# Patient Record
Sex: Female | Born: 1970 | Race: White | Hispanic: Yes | Marital: Married | Smoking: Never smoker
Health system: Southern US, Community
[De-identification: ages and names within clinical notes are randomized; demographics above are authoritative.]

## PROBLEM LIST (undated history)

## (undated) DIAGNOSIS — I10 Essential (primary) hypertension: Secondary | ICD-10-CM

## (undated) DIAGNOSIS — K219 Gastro-esophageal reflux disease without esophagitis: Secondary | ICD-10-CM

## (undated) HISTORY — DX: Essential (primary) hypertension: I10

## (undated) HISTORY — DX: Gastro-esophageal reflux disease without esophagitis: K21.9

---

## 2002-07-12 ENCOUNTER — Encounter: Payer: Self-pay | Admitting: Emergency Medicine

## 2002-07-12 ENCOUNTER — Encounter: Payer: Self-pay | Admitting: Family Medicine

## 2002-07-12 ENCOUNTER — Inpatient Hospital Stay (HOSPITAL_COMMUNITY): Admission: AD | Admit: 2002-07-12 | Discharge: 2002-07-12 | Payer: Self-pay | Admitting: Gynecology

## 2002-11-26 ENCOUNTER — Inpatient Hospital Stay (HOSPITAL_COMMUNITY): Admission: AD | Admit: 2002-11-26 | Discharge: 2002-11-28 | Payer: Self-pay | Admitting: Obstetrics and Gynecology

## 2010-03-13 ENCOUNTER — Encounter: Admission: RE | Admit: 2010-03-13 | Discharge: 2010-03-13 | Payer: Self-pay | Admitting: Geriatric Medicine

## 2011-04-23 ENCOUNTER — Ambulatory Visit: Payer: Self-pay | Attending: Neurology

## 2011-04-23 DIAGNOSIS — IMO0001 Reserved for inherently not codable concepts without codable children: Secondary | ICD-10-CM | POA: Insufficient documentation

## 2011-04-23 DIAGNOSIS — R51 Headache: Secondary | ICD-10-CM | POA: Insufficient documentation

## 2011-04-23 DIAGNOSIS — M2569 Stiffness of other specified joint, not elsewhere classified: Secondary | ICD-10-CM | POA: Insufficient documentation

## 2011-04-23 DIAGNOSIS — M542 Cervicalgia: Secondary | ICD-10-CM | POA: Insufficient documentation

## 2011-05-08 ENCOUNTER — Ambulatory Visit: Payer: Self-pay | Attending: Neurology | Admitting: Physical Therapy

## 2011-05-08 DIAGNOSIS — M2569 Stiffness of other specified joint, not elsewhere classified: Secondary | ICD-10-CM | POA: Insufficient documentation

## 2011-05-08 DIAGNOSIS — IMO0001 Reserved for inherently not codable concepts without codable children: Secondary | ICD-10-CM | POA: Insufficient documentation

## 2011-05-08 DIAGNOSIS — M542 Cervicalgia: Secondary | ICD-10-CM | POA: Insufficient documentation

## 2011-05-08 DIAGNOSIS — R51 Headache: Secondary | ICD-10-CM | POA: Insufficient documentation

## 2011-07-24 ENCOUNTER — Other Ambulatory Visit: Payer: Self-pay | Admitting: Neurology

## 2011-07-24 DIAGNOSIS — R51 Headache: Secondary | ICD-10-CM

## 2011-08-06 ENCOUNTER — Ambulatory Visit
Admission: RE | Admit: 2011-08-06 | Discharge: 2011-08-06 | Disposition: A | Discharge status: Home / Self Care | Payer: No Typology Code available for payment source | Source: Ambulatory Visit | Attending: Neurology | Admitting: Neurology

## 2011-08-06 DIAGNOSIS — R51 Headache: Secondary | ICD-10-CM

## 2012-12-18 IMAGING — CT CT HEAD W/O CM
2 series · 16 of 30 positions shown, 20 images · non-contrast
Comparison: none

[Series 3: head bone · axial · 0.49mm/px · z∈[+55,+96]mm · 3 of 28 slices shown]
[im 2/28  bone]
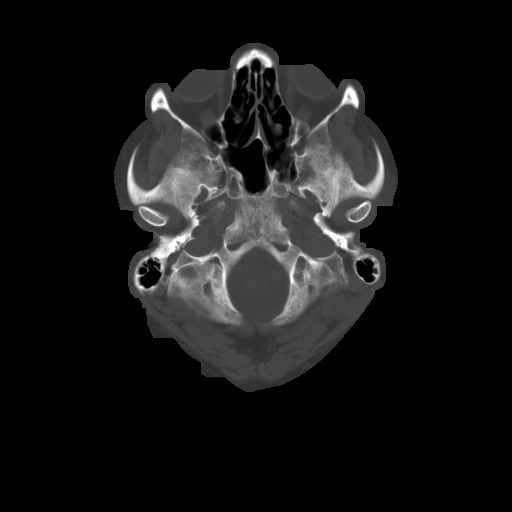
[im 6/28  bone]
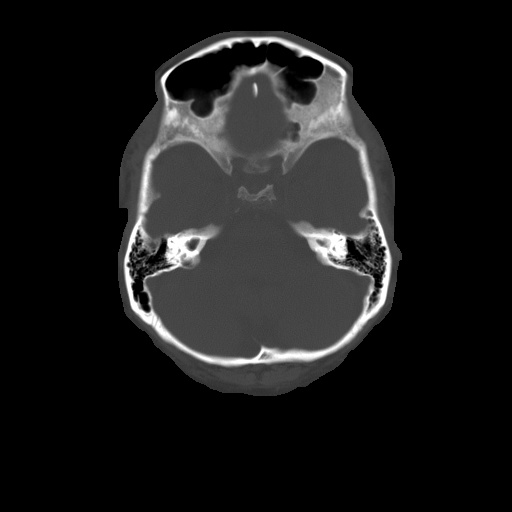
[im 10/28  bone]
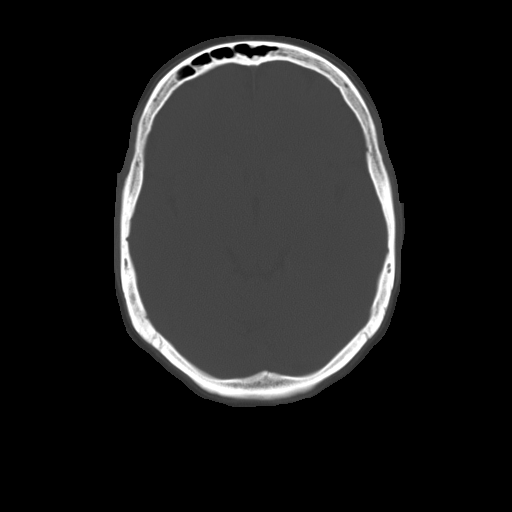

[Series 32: 3d filtered head w/o · axial · non-contrast · 0.49mm/px · z∈[+55,+179]mm · 13 of 28 slices shown, 17 images]
[im 2/28  brain]
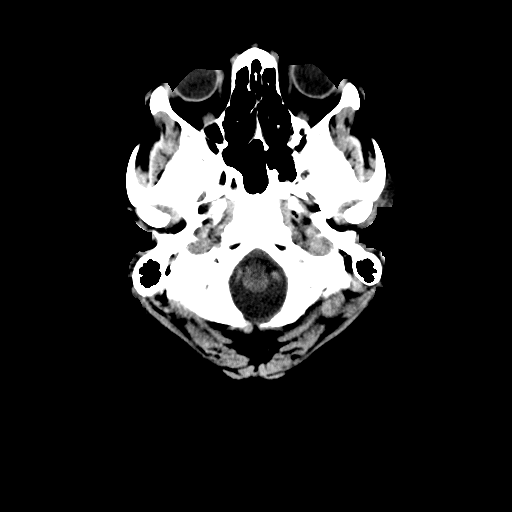
[im 2/28  bone]
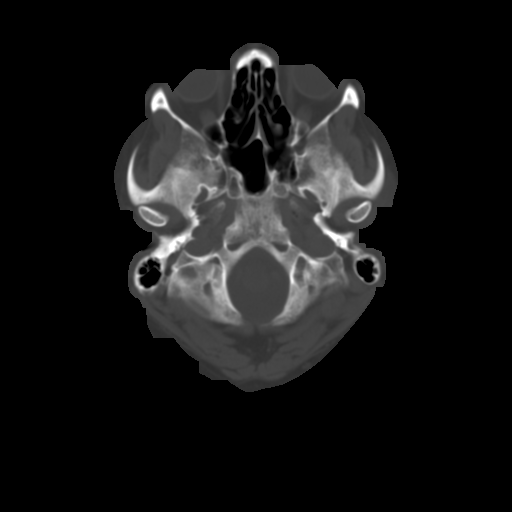
[im 4/28  brain]
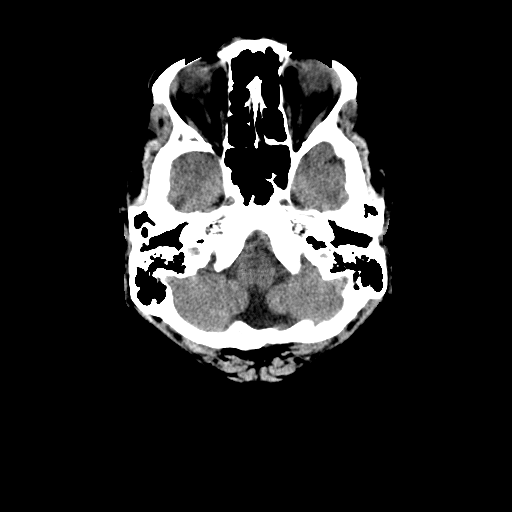
[im 6/28  brain]
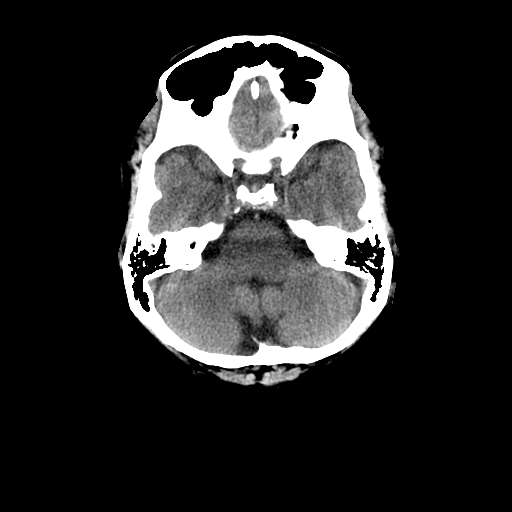
[im 8/28  brain]
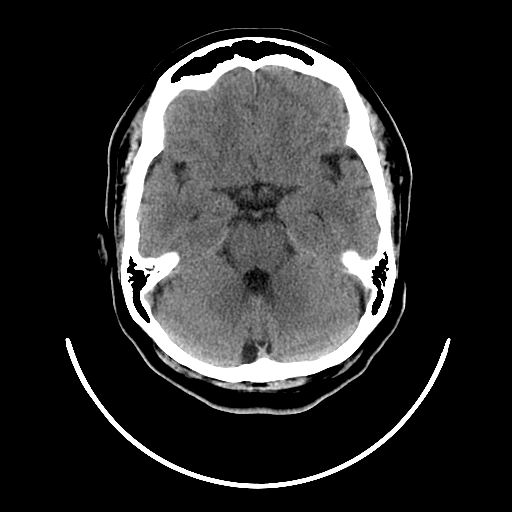
[im 10/28  brain]
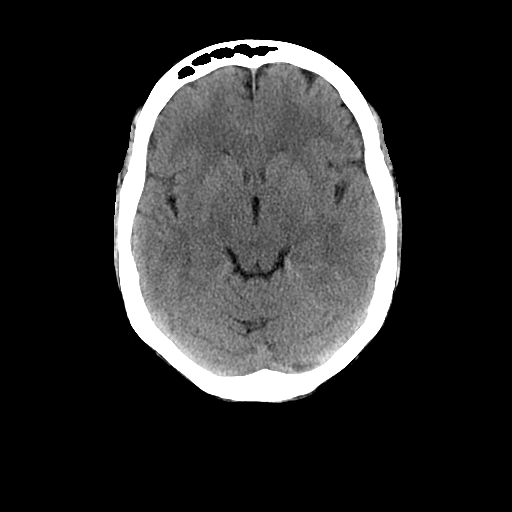
[im 10/28  bone]
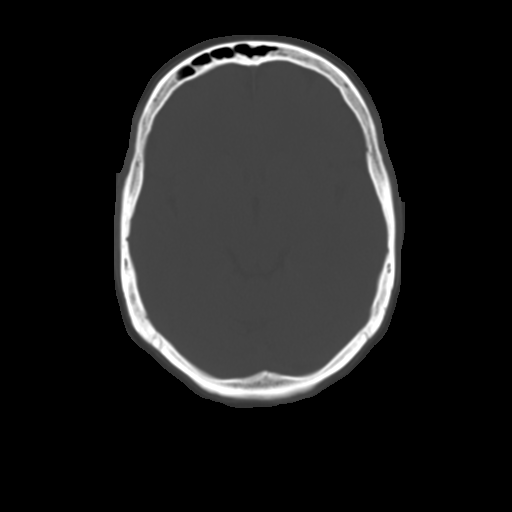
[im 12/28  brain]
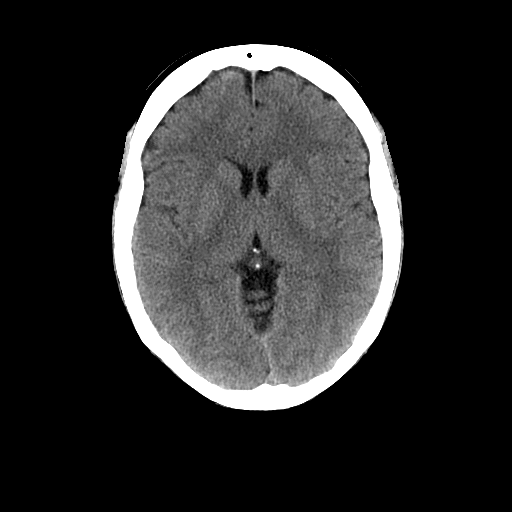
[im 14/28  brain]
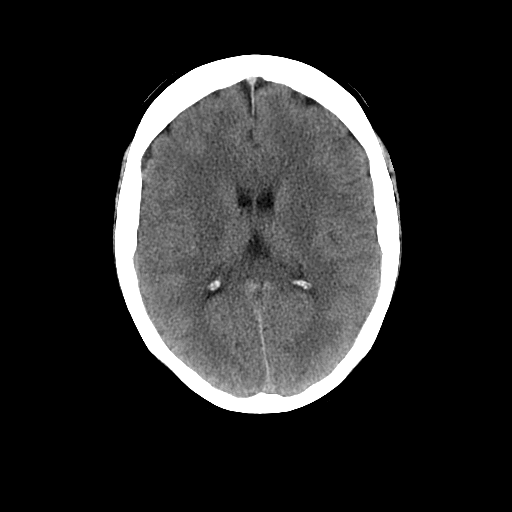
[im 16/28  brain]
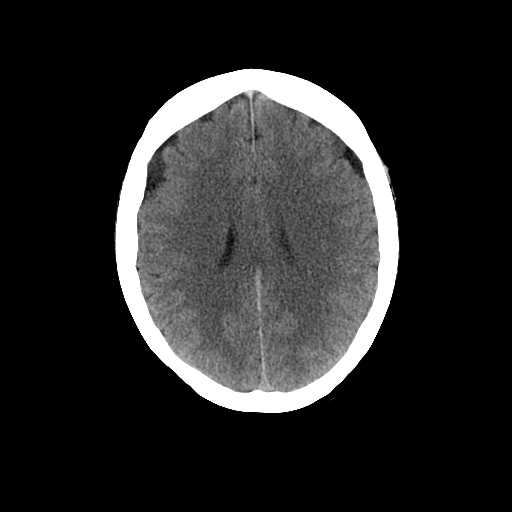
[im 18/28  brain]
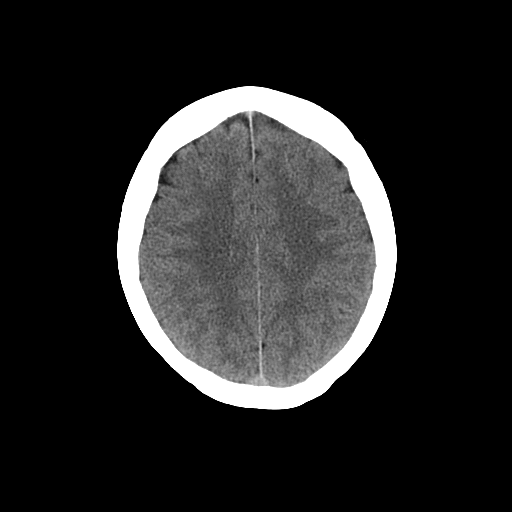
[im 18/28  bone]
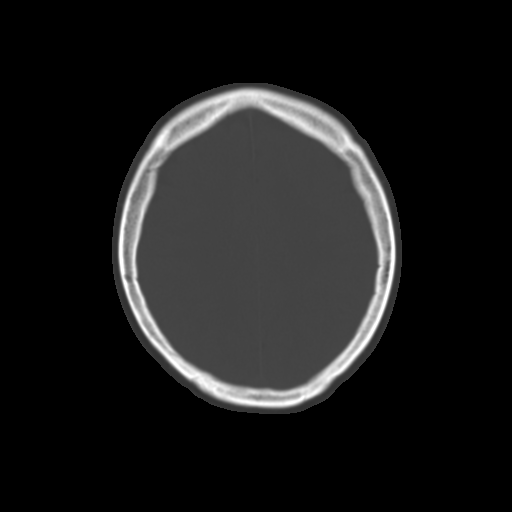
[im 20/28  brain]
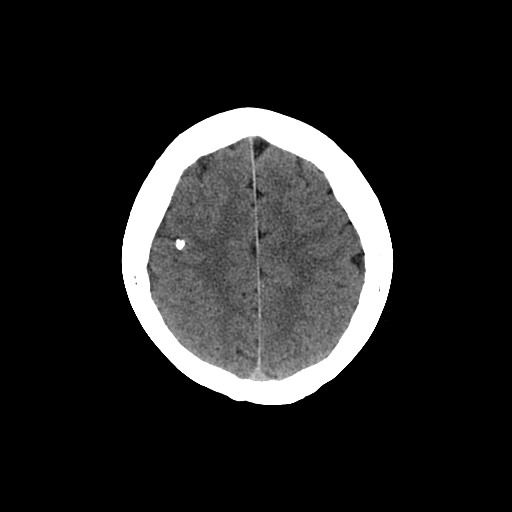
[im 22/28  brain]
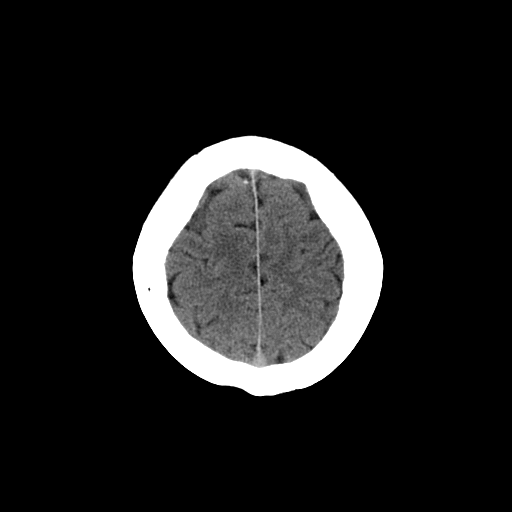
[im 24/28  brain]
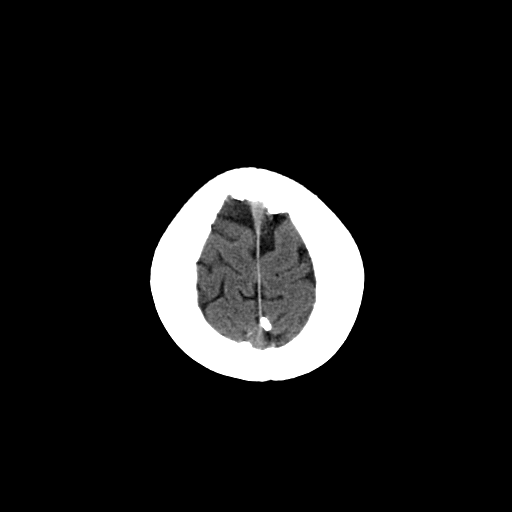
[im 26/28  brain]
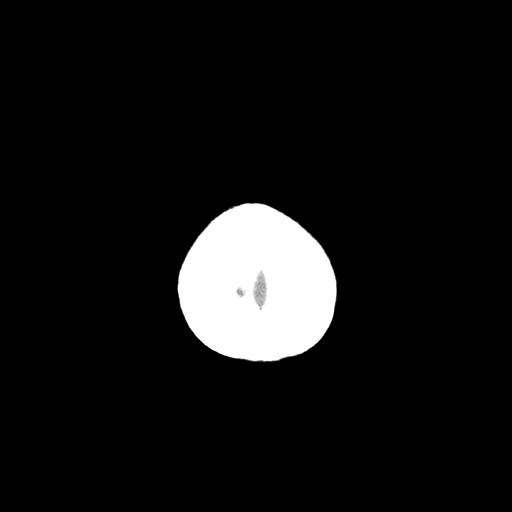
[im 26/28  bone]
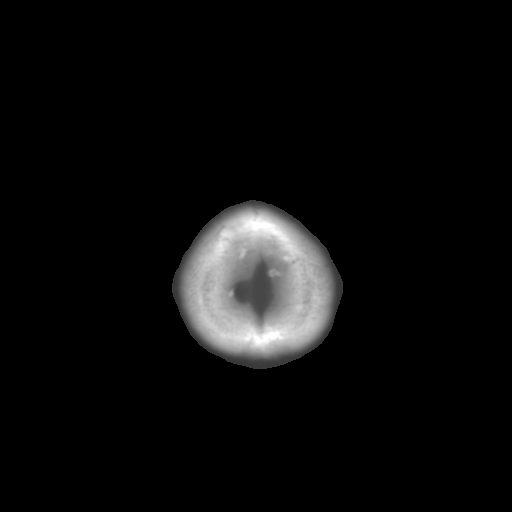

[16 of 30 positions shown; findings below may reference images not displayed]

This examination was performed at [HOSPITAL] at [HOSPITAL]
[HOSPITAL]. The interpretation will be provided by [REDACTED].

## 2013-08-18 ENCOUNTER — Encounter (HOSPITAL_COMMUNITY): Payer: Self-pay | Admitting: Emergency Medicine

## 2013-08-18 ENCOUNTER — Emergency Department (HOSPITAL_COMMUNITY)
Admission: EM | Admit: 2013-08-18 | Discharge: 2013-08-18 | Disposition: A | Discharge status: Home / Self Care | Payer: BC Managed Care – PPO | Attending: Emergency Medicine | Admitting: Emergency Medicine

## 2013-08-18 DIAGNOSIS — R209 Unspecified disturbances of skin sensation: Secondary | ICD-10-CM | POA: Insufficient documentation

## 2013-08-18 DIAGNOSIS — M79609 Pain in unspecified limb: Secondary | ICD-10-CM | POA: Insufficient documentation

## 2013-08-18 DIAGNOSIS — R1013 Epigastric pain: Secondary | ICD-10-CM | POA: Insufficient documentation

## 2013-08-18 DIAGNOSIS — M62838 Other muscle spasm: Secondary | ICD-10-CM | POA: Insufficient documentation

## 2013-08-18 MED ORDER — CYCLOBENZAPRINE HCL 10 MG PO TABS: 10.0000 mg | ORAL_TABLET | Freq: Three times a day (TID) | ORAL | Status: DC | PRN

## 2013-08-18 MED ORDER — HYDROCODONE-ACETAMINOPHEN 5-325 MG PO TABS: 1.0000 | ORAL_TABLET | Freq: Four times a day (QID) | ORAL | Status: DC | PRN

## 2013-08-18 NOTE — Discharge Instructions (Signed)
[employer] [phone]  Calambres y espasmos musculares  (Muscle Cramps and Spasms)  Los calambres musculares y espasmos ocurren cuando un msculo o grupos de msculos se tensan y no se tiene control sobre esta tensin (contraccin muscular involuntaria). Es un problema comn y Software engineerpuede aparecer en cualquier msculo. La zona ms comn son los msculos de la pantorrilla. Tanto los Liberty Globalcalambres como los espasmos son contracciones musculares involuntarias, pero tambin tienen diferencias:   Los calambres musculares son espordicos y Engineer, miningdolorosos. Pueden durar entre algunos segundos hasta un cuarto de [place]hora. Los calambres musculares son ms fuertes y duran ms que los espasmos musculares.  Los espasmos pueden o no ser dolorosos. Pueden durar algunos segundos o mucho ms. CAUSAS  No es frecuente que los calambres se deban a un trastorno subyacente grave. En muchos casos, la causa de los calambres y los espasmos es desconocida. Algunas causas frecuentes son:   Esfuerzo excesivo.   El uso excesivo del msculo por movimientos repetitivos (hacer lo mismo una y Laverda Pageotra vez).   Permanecer en cierta posicin durante un largo perodo de Allporttiempo.   Preparacin, forma o tcnica inadecuada al realizar un deporte o [place] Orleansactividad.   Deshidratacin.   Traumatismos.   Efectos secundarios de algunos medicamentos.  Niveles anormalmente bajos de las sales e iones en la sangre (electrolitos), especialmente el potasio y el calcio. Pueden ocurrir cuando se toman pldoras para Geographical information systems officerorinar (diurticos) o en las mujeres embarazadas.  Algunos problemas mdicos subyacentes pueden hacer que sea ms propenso a desarrollar calambres o espasmos. Estos incluyen, pero no se limitan a:   Diabetes.   Enfermedad de Parkinson.   Trastornos hormonales, tales como problemas de la tiroides.   El consumo excesivo de alcohol.   Enfermedades especficas de Harrah's Entertainmentlos msculos, las articulaciones y [place]los huesos.   Enfermedad vascular en la  que no llega suficiente sangre a los msculos.  INSTRUCCIONES PARA EL CUIDADO EN EL HOGAR   Mantngase bien hidratado. Beba gran cantidad de lquido para mantener la orina de tono claro o color amarillo plido.  Puede ser til Engineer, maintenance (IT)masajear, Therapist, musicelongar y International aid/development workerrelajar el msculo afectado.  Para los msculos tensos o apretados, use una toalla caliente, una almohadilla trmica o agua caliente de la ducha dirigida a la zona afectada.  Si est dolorido o siente dolor despus de un calambre o espasmo, aplique hielo en el rea afectada para Acupuncturistaliviar el malestar.  Ponga el hielo en una bolsa plstica.  Colquese una toalla entre la piel y la bolsa de hielo.  Deje el hielo en el lugar durante 15 a 20 minutos, 3 a 4 veces por da.  Los medicamentos que se utilizan para tratar las causas conocidas de los calambres o espasmos pueden reducir su frecuencia o gravedad. Tome slo medicamentos de venta libre o recetados, segn las indicaciones del mdico. SOLICITE ATENCIN MDICA SI:  Los calambres o espasmos empeoran, ocurren con ms frecuencia o no mejoran con Museum/gallery conservatorel tiempo.  ASEGRESE DE QUE:   Comprende estas instrucciones.  Controlar su enfermedad.  Solicitar ayuda de inmediato si no mejora o si empeora. Document Released: 01/30/2005 Document Revised: 08/17/2012 [facility] Patient Information 2014 [place]ExitCare, [place]. Cyclobenzaprine tablets Qu es este medicamento? La CICLOBENZAPRINA es un relajante muscular. Se utiliza para tratar Starwood Hotelsel dolor y la rigidez de los msculos y espasmos musculares. Este medicamento puede ser utilizado para otros usos; si tiene alguna pregunta consulte con su proveedor de atencin mdica o con su farmacutico. MARCAS COMERCIALES DISPONIBLES: Fexmid, Flexeril Qu le debo informar a mi profesional de  la salud antes de tomar este medicamento? Necesita saber si usted presenta alguno de los siguientes problemas o situaciones: -enfermedad cardiaca, pulso cardiaco irregular o ataque cardiaco  previo -enfermedad heptica -problemas tiroideos -una reaccin alrgica o inusual a la ciclobenzaprina, antidepresivos tricclicos, lactosa, a otros medicamentos, alimentos, colorantes o conservadores -si est embarazada o buscando quedar embarazada -si est amamantando a un beb Cmo debo utilizar este medicamento? Tome este medicamento por va oral con un vaso de agua. Siga las instrucciones de la etiqueta del [place]. Si este Social worker, tmelo con alimentos o con [employer]. No tome su medicamento con una frecuencia mayor a la indicada. Hable con su pediatra para informarse acerca del uso de este medicamento en nios. Puede requerir atencin especial. Sobredosis: Pngase en contacto inmediatamente con un centro toxicolgico o una sala de urgencia si usted cree que haya tomado demasiado medicamento. ATENCIN: [employer] es solo para usted. No comparta este medicamento con nadie. Qu sucede si me olvido de una dosis? Si olvida una dosis, tmela lo antes posible. Si es casi la hora de su dosis siguiente, tome slo esa dosis. No tome dosis adicionales o dobles. Qu puede interactuar con este medicamento? No tome esta medicina con ninguno de los siguientes medicamentos: -ciertos medicamentos para infecciones micticas, tales como fluconazol, quetoconazol, itraconazol, posaconazol, voriconazol -cisapride -dofetilida -dronedarona -droperidol -flecainida -grepafloxacino -halofantrina -levometadilo -IMAOs tales como Carbex, Eldepryl, Marplan, Nardil y Parnate -nilotinib -pimozida -probucol -sertindol -tioridazina -ziprasidona Esta medicina tambin puede interactuar con los siguientes medicamentos: -abarlix -alcohol -ciertos medicamentos para el cncer -ciertos medicamentos para la depresin, ansiedad o trastornos psicticos -ciertos medicamentos para infecciones, tales como alfuzosn, cloroquina, claritromicina, levofloxacino, mefloquina,  pentamidina, troleandomicina -ciertos medicamentos para el pulso cardiaco irregular -ciertos medicamentos usados para hacerle dormir o para entumecer durante una ciruga o procedimiento -colorantes de Arts development officer -dolasetrn -guanetidina -metadona -octreotida -ondansetrn -otros medicamentos que prolongan el intervalo QT (causa un ritmo cardiaco anormal) -palonosetrn -fenotiazinas, tales como clorpromacina, mesoridazina, proclorperazina, tioridazina -tramadol -vardenafil Puede ser que esta lista no menciona todas las posibles interacciones. Informe a su profesional de [employer] de [employer] Inc productos a base de hierbas, medicamentos de [place] o suplementos nutritivos que est tomando. Si usted fuma, consume bebidas alcohlicas o si utiliza drogas ilegales, indqueselo tambin a su profesional de [employer]. Algunas sustancias pueden interactuar con su medicamento. A qu debo estar atento al usar [employer]? Consulte a su mdico o a su profesional de la salud si su problema no mejora en 1 a 3 semanas. Puede experimentar somnolencia o mareos al comenzar con el medicamento o al cambiar de dosis. No conduzca ni utilice maquinaria, ni haga nada que sea peligroso hasta que sepa cmo le afecta este medicamento. Sintese y pngase de pie lentamente. Se le podr secar la boca. Masticar chicle sin azcar, chupar caramelos duros y beber agua le ayudarn a Pharmacologist la boca hmeda. Qu efectos secundarios puedo tener al [employer] este medicamento? Efectos secundarios que debe informar a su mdico o a Producer, television/film/video de la salud tan pronto como sea posible: -Therapist, art como erupcin cutnea, picazn o urticarias, hinchazn de la cara, labios o lengua -dolor en el pecho -pulso cardiaco rpido -alucinaciones -convulsiones -vmito Efectos secundarios que, por lo general, no requieren atencin mdica (debe informarlos a su mdico o a su profesional de la salud si persisten o si son  molestos): -dolor de cabeza Puede ser que esta lista no menciona todos los posibles efectos secundarios. Comunquese a su mdico por asesoramiento  mdico Hewlett-Packardsobre los efectos secundarios. Usted puede informar los efectos secundarios a la FDA por telfono al 1-800-FDA-1088. Dnde debo guardar mi medicina? Mantngala fuera del alcance de los nios. Gurdela a Sanmina-SCItemperatura ambiente, entre 15 y 30 grados C ([phone] y [phone] grados F). Mantenga el envase bien cerrado. Deseche todo el medicamento que no haya utilizado, despus de la fecha de vencimiento. ATENCIN: Este folleto es un resumen. Puede ser que no cubra toda la posible informacin. Si usted tiene preguntas acerca de esta medicina, consulte con su mdico, su farmacutico o su profesional de Radiographer, therapeuticla salud.  2014, Elsevier/Gold Standard. (2012-12-11 16:35:40)  Cyclobenzaprine tablets Qu es este medicamento? La CICLOBENZAPRINA es un relajante muscular. Se utiliza para tratar Starwood Hotelsel dolor y la rigidez de los msculos y espasmos musculares. Este medicamento puede ser utilizado para otros usos; si tiene alguna pregunta consulte con su proveedor de atencin mdica o con su farmacutico. MARCAS COMERCIALES DISPONIBLES: Fexmid, Flexeril Qu le debo informar a mi profesional de la salud antes de tomar este medicamento? Necesita saber si usted presenta alguno de los siguientes problemas o situaciones: -enfermedad cardiaca, pulso cardiaco irregular o ataque cardiaco previo -enfermedad heptica -problemas tiroideos -una reaccin alrgica o inusual a la ciclobenzaprina, antidepresivos tricclicos, lactosa, a otros medicamentos, alimentos, colorantes o conservadores -si est embarazada o buscando quedar embarazada -si est amamantando a un beb Cmo debo utilizar este medicamento? Tome este medicamento por va oral con un vaso de agua. Siga las instrucciones de la etiqueta del [place] Creekmedicamento. Si este Social workermedicamento le produce malestar estomacal, tmelo con alimentos o con  WPS Resourcesleche. No tome su medicamento con una frecuencia mayor a la indicada. Hable con su pediatra para informarse acerca del uso de este medicamento en nios. Puede requerir atencin especial. Sobredosis: Pngase en contacto inmediatamente con un centro toxicolgico o una sala de urgencia si usted cree que haya tomado demasiado medicamento. ATENCIN: Reynolds AmericanEste medicamento es solo para usted. No comparta este medicamento con nadie. Qu sucede si me olvido de una dosis? Si olvida una dosis, tmela lo antes posible. Si es casi la hora de su dosis siguiente, tome slo esa dosis. No tome dosis adicionales o dobles. Qu puede interactuar con este medicamento? No tome esta medicina con ninguno de los siguientes medicamentos: -ciertos medicamentos para infecciones micticas, tales como fluconazol, quetoconazol, itraconazol, posaconazol, voriconazol -cisapride -dofetilida -dronedarona -droperidol -flecainida -grepafloxacino -halofantrina -levometadilo -IMAOs tales como Carbex, Eldepryl, Marplan, Nardil y Parnate -nilotinib -pimozida -probucol -sertindol -tioridazina -ziprasidona Esta medicina tambin puede interactuar con los siguientes medicamentos: -abarlix -alcohol -ciertos medicamentos para el cncer -ciertos medicamentos para la depresin, ansiedad o trastornos psicticos -ciertos medicamentos para infecciones, tales como alfuzosn, cloroquina, claritromicina, levofloxacino, mefloquina, pentamidina, troleandomicina -ciertos medicamentos para el pulso cardiaco irregular -ciertos medicamentos usados para hacerle dormir o para entumecer durante una ciruga o procedimiento -colorantes de Arts development officercontraste -dolasetrn -guanetidina -metadona -octreotida -ondansetrn -otros medicamentos que prolongan el intervalo QT (causa un ritmo cardiaco anormal) -palonosetrn -fenotiazinas, tales como clorpromacina, mesoridazina, proclorperazina, tioridazina -tramadol -vardenafil Puede ser que esta lista no  menciona todas las posibles interacciones. Informe a su profesional de Beazer Homesla salud de Ingram Micro Inctodos los productos a base de hierbas, medicamentos de University of Virginiaventa libre o suplementos nutritivos que est tomando. Si usted fuma, consume bebidas alcohlicas o si utiliza drogas ilegales, indqueselo tambin a su profesional de Beazer Homesla salud. Algunas sustancias pueden interactuar con su medicamento. A qu debo estar atento al usar PPL Corporationeste medicamento? Consulte a su mdico o a su profesional de la salud si su problema no mejora en 1 a  3 semanas. Puede experimentar somnolencia o mareos al comenzar con el medicamento o al cambiar de dosis. No conduzca ni utilice maquinaria, ni haga nada que sea peligroso hasta que sepa cmo le afecta este medicamento. Sintese y pngase de pie lentamente. Se le podr secar la boca. Masticar chicle sin azcar, chupar caramelos duros y beber agua le ayudarn a Pharmacologist la boca hmeda. Qu efectos secundarios puedo tener al [employer] este medicamento? Efectos secundarios que debe informar a su mdico o a Producer, television/film/video de la salud tan pronto como sea posible: -Therapist, art como erupcin cutnea, picazn o urticarias, hinchazn de la cara, labios o lengua -dolor en el pecho -pulso cardiaco rpido -alucinaciones -convulsiones -vmito Efectos secundarios que, por lo general, no requieren atencin mdica (debe informarlos a su mdico o a su profesional de la salud si persisten o si son molestos): -dolor de cabeza Puede ser que esta lista no menciona todos los posibles efectos secundarios. Comunquese a su mdico por asesoramiento mdico Hewlett-[employer]ard. Usted puede informar los efectos secundarios a la FDA por telfono al 1-800-FDA-1088. Dnde debo guardar mi medicina? Mantngala fuera del alcance de los nios. Gurdela a Sanmina-SCI, entre 15 y 30 grados C (40 y 70 grados F). Mantenga el envase bien cerrado. Deseche todo el medicamento que no haya utilizado, despus de la  fecha de vencimiento. ATENCIN: Este folleto es un resumen. Puede ser que no cubra toda la posible informacin. Si usted tiene preguntas acerca de esta medicina, consulte con su mdico, su farmacutico o su profesional de Radiographer, therapeutic.  2014, Elsevier/Gold Standard. (2012-12-11 16:35:40)

## 2013-08-18 NOTE — ED Provider Notes (Signed)
CSN: [id number]     Arrival date & time 08/18/13  1549 History   First MD Initiated Contact with Patient 08/18/13 1711     This chart was scribed for non-physician practitioner, Arthor CaptainAbigail Banks Chaikin, PA-C, working with Gwyneth SproutWhitney Plunkett, MD by Arlan OrganAshley Leger, ED Scribe. This patient was seen in room TR08C/TR08C and the patient's care was started at 6:29 PM.   Chief Complaint  Patient presents with  . Back Pain   The history is provided by the patient. No language interpreter was used.    HPI Comments: Michelle Clay is a 43 y.o. female who presents to the Emergency Department complaining of intermittent lower back pain that has been ongoing for some time now. She states this pain radiates into her L arm and has noted associated mild paresthesia to the arm. She describes this pain as sharp. No alleviating or aggravating factors at this time. Denies any new injury or heavy lifting. She states this pain is exacerbated with deep breathing. Pt also admits to epigastric abdominal pain. She has been taking Advil once daily with mild temporary improvement. She has also recently noted bilateral calf pain. Denies any history of lower extremity or lung blood clots. No history of heart issues. Pt is currently not a smoker. No family history of MI's. Denies weakness, loss of bowel/bladder function or saddle anesthesia. Denies neck stiffness, headache, rash.  Denies fever or recent procedures to back. No coughing up blood. 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 No history of high cholesterol or HTN. No other concerns this visit.  History reviewed. No pertinent past medical history. History reviewed. No pertinent past surgical history. No family history on file. History  Substance Use Topics  . Smoking status: Never Smoker   . Smokeless tobacco: Not on file  . Alcohol Use: No   OB History   Grav Para Term Preterm Abortions TAB SAB Ect Mult Living                  Review of Systems  Constitutional: Negative for fever and chills.  HENT: Negative for congestion.   Eyes: Negative for redness.  Respiratory: Negative for cough.   Gastrointestinal: Positive for abdominal pain.  Musculoskeletal: Positive for arthralgias and back pain.  Skin: Negative for rash.  Psychiatric/Behavioral: Negative for confusion.      Allergies  Review of patient's allergies indicates no known allergies.  Home Medications   Prior to Admission medications   Not on File   Triage Vitals: BP 151/71  Pulse 81  Temp(Src) 98.2 F (36.8 C) (Oral)  Resp 18  SpO2 99%  LMP 08/18/2013   Physical Exam  Nursing note and vitals reviewed. Constitutional: She is oriented to person, place, and time. She appears well-developed and well-nourished.  HENT:  Head: Normocephalic and atraumatic.  Eyes: EOM are normal.  Neck: Normal range of motion.  Cardiovascular: Normal rate.   Pulmonary/Chest: Effort normal.  Musculoskeletal: Normal range of motion. She exhibits tenderness. She exhibits no edema.  FROM with pain Spasms over trapezius muscle; can palpate and reproduce the pain in her L arm  Neurological: She is alert and oriented to person, place, and time.  Skin: Skin is warm and dry.  Psychiatric: She has a normal mood and affect. Her behavior is normal.    ED Course  Procedures (including critical care time)  DIAGNOSTIC STUDIES: Oxygen Saturation is 99% on RA, Normal by my interpretation.    COORDINATION  OF CARE: 6:39 PM-Discussed treatment plan with pt at bedside and pt agreed to plan.     Labs Review Labs Reviewed - No data to display  Imaging Review No results found.   EKG Interpretation None      MDM   Final diagnoses:  Trapezius muscle spasm    Patient with back pain.  No neurological deficits and normal neuro exam.  Patient can walk but states is painful.  No loss of bowel or bladder control.  No concern for cauda equina.  No fever, night  sweats, weight loss, h/o cancer, IVDU.  RICE protocol and pain medicine indicated and discussed with patient.    I personally performed the services described in this documentation, which was scribed in my presence. The recorded information has been reviewed and is accurate.    Arthor CaptainAbigail Orvel Cutsforth, PA-C 08/24/13 [phone]

## 2013-08-18 NOTE — ED Notes (Signed)
The pt has had lower back pain for a long time.  No known injury.  More pain for a few days

## 2013-08-25 NOTE — ED Provider Notes (Signed)
Medical screening examination/treatment/procedure(s) were performed by non-physician practitioner and as supervising physician I was immediately available for consultation/collaboration.   EKG Interpretation None        Noriko Macari, MD 08/25/13 1542 

## 2014-08-19 ENCOUNTER — Encounter (HOSPITAL_COMMUNITY): Payer: Self-pay | Admitting: Emergency Medicine

## 2014-08-19 ENCOUNTER — Emergency Department (HOSPITAL_COMMUNITY)
Admission: EM | Admit: 2014-08-19 | Discharge: 2014-08-19 | Disposition: A | Discharge status: Home / Self Care | Payer: BLUE CROSS/BLUE SHIELD | Source: Home / Self Care

## 2014-08-19 DIAGNOSIS — A048 Other specified bacterial intestinal infections: Secondary | ICD-10-CM

## 2014-08-19 DIAGNOSIS — B9681 Helicobacter pylori [H. pylori] as the cause of diseases classified elsewhere: Secondary | ICD-10-CM | POA: Diagnosis not present

## 2014-08-19 DIAGNOSIS — R1013 Epigastric pain: Secondary | ICD-10-CM | POA: Diagnosis not present

## 2014-08-19 LAB — POCT PREGNANCY, URINE: Preg Test, Ur: NEGATIVE

## 2014-08-19 LAB — POCT URINALYSIS DIP (DEVICE)
Bilirubin Urine: NEGATIVE
GLUCOSE, UA: NEGATIVE mg/dL
Hgb urine dipstick: NEGATIVE
Ketones, ur: NEGATIVE mg/dL
Leukocytes, UA: NEGATIVE
NITRITE: NEGATIVE
Protein, ur: NEGATIVE mg/dL
SPECIFIC GRAVITY, URINE: 1.015 (ref 1.005–1.030)
UROBILINOGEN UA: 0.2 mg/dL (ref 0.0–1.0)
pH: 7.5 (ref 5.0–8.0)

## 2014-08-19 LAB — POCT H PYLORI SCREEN: H. PYLORI SCREEN, POC: POSITIVE — AB

## 2014-08-19 MED ORDER — AMOXICILLIN 500 MG PO CAPS: 1000.0000 mg | ORAL_CAPSULE | Freq: Two times a day (BID) | ORAL | Status: DC

## 2014-08-19 MED ORDER — ONDANSETRON HCL 4 MG PO TABS: 4.0000 mg | ORAL_TABLET | Freq: Three times a day (TID) | ORAL | Status: DC | PRN

## 2014-08-19 MED ORDER — OMEPRAZOLE 20 MG PO CPDR: 20.0000 mg | DELAYED_RELEASE_CAPSULE | Freq: Two times a day (BID) | ORAL | Status: DC

## 2014-08-19 MED ORDER — CLARITHROMYCIN 500 MG PO TABS: 500.0000 mg | ORAL_TABLET | Freq: Two times a day (BID) | ORAL | Status: DC

## 2014-08-19 NOTE — Discharge Instructions (Signed)
The cause of your abdominal pain is not immediately clear but is likely due to multiple things such as an H. pylori infection as well as stones. It may also be related to pancreatitis. Please go to the emergency room if her pain returns. Please start the 3 medicines prescribed for the infection and Zofran for nausea.  La causa de su dolor abdominal no es claro, pero probablemente se debe a varias cosas tales como una infeccin por H. pylori, as como piedras. Tambin puede estar relacionado con pancreatitis. Por favor, vaya a la sala de emergencia si su dolor vuelve. Por favor, iniciar las 3 medicinas prescritas para la infeccin y Zofran para las nuseas.

## 2014-08-19 NOTE — ED Provider Notes (Signed)
CSN: [id number]     Arrival date & time 08/19/14  1116 History   None    Chief Complaint  Patient presents with  . Abdominal Pain   (Consider location/radiation/quality/duration/timing/severity/associated sxs/prior Treatment) HPI  Abdominal pain: started 2 years ago. Intermittent. Multiple ED visits for pain w/o Dx. Last episode of pain was 1 day ago. Epigastric. "mild" but sometimes shooting. Improving. Improving. Nothing relieves the pain. Has not taken anything for the pain. BM daily. Non-radiating. Associated w/ emesis after meals. Worse w/ food.    LMP 07/26/14.    History reviewed. No pertinent past medical history. History reviewed. No pertinent past surgical history. Family History  Problem Relation Age of Onset  . Family history unknown: Yes   History  Substance Use Topics  . Smoking status: Never Smoker   . Smokeless tobacco: Not on file  . Alcohol Use: No   OB History    No data available     Review of Systems Per HPI with all other pertinent systems negative.   Allergies  Review of patient's allergies indicates no known allergies.  Home Medications   Prior to Admission medications   Not on File   BP 134/74 mmHg  Pulse 73  Temp(Src) 97.9 F (36.6 C) (Oral)  Resp 16  SpO2 100%  LMP 07/26/2014 Physical Exam Physical Exam  Constitutional: oriented to person, place, and time. appears well-developed and well-nourished. No distress.  HENT:  Head: Normocephalic and atraumatic.  Eyes: EOMI. PERRL.  Neck: Normal range of motion.  Cardiovascular: RRR, no m/r/g, 2+ distal pulses,  Pulmonary/Chest: Effort normal and breath sounds normal. No respiratory distress.  Abdominal: Soft. Bowel sounds are normal. NonTTP, no distension. negative Murphy's sign.  Musculoskeletal: Normal range of motion. Non ttp, no effusion.  Neurological: alert and oriented to person, place, and time.  Skin: Skin is warm. No rash noted. non diaphoretic.  Psychiatric: normal mood and  affect. behavior is normal. Judgment and thought content normal.   ED Course  Procedures (including critical care time) Labs Review Labs Reviewed - No data to display  Imaging Review No results found.   MDM  No diagnosis found.  Suspect her pain is likely multifactorial which includes an H. pylori infection and possible cholecystitis.. Unlikely pancreatitis, appendicitis or other intra-abdominal process. Zofran Clarithromycin, amoxicillin, PPI Follow-up in the ED if patient's abdominal pain comes back. Urinalysis negative, urine pregnancy negative   Ozella Rocksavid J Merrell, MD 08/19/14 [phone]

## 2014-08-19 NOTE — ED Notes (Signed)
C/o intermittent epigastric pain onset 2 years  Pain increases w/foods Denies fevers, chills, urinary/gyn sx Alert, no signs of acute distress.

## 2017-06-30 ENCOUNTER — Other Ambulatory Visit: Payer: Self-pay

## 2017-07-04 LAB — CYTOLOGY - PAP: Diagnosis: NEGATIVE

## 2019-06-17 ENCOUNTER — Other Ambulatory Visit (HOSPITAL_COMMUNITY): Payer: Self-pay

## 2019-06-17 DIAGNOSIS — Z1231 Encounter for screening mammogram for malignant neoplasm of breast: Secondary | ICD-10-CM

## 2019-07-22 ENCOUNTER — Encounter: Payer: Self-pay | Admitting: Women's Health

## 2019-07-22 ENCOUNTER — Other Ambulatory Visit: Payer: Self-pay

## 2019-07-22 ENCOUNTER — Ambulatory Visit: Payer: Self-pay | Admitting: Women's Health

## 2019-07-22 VITALS — BP 150/98

## 2019-07-22 DIAGNOSIS — Z1239 Encounter for other screening for malignant neoplasm of breast: Secondary | ICD-10-CM

## 2019-07-22 NOTE — Progress Notes (Signed)
Ms. Michelle Clay is a 49 y.o. female who presents to [facility] clinic today with no complaints.    Pap Smear: Pap not smear completed today. Last Pap smear was 06/30/2017 at [facility] clinic and was normal. Per patient has no history of an abnormal Pap smear. Last Pap smear result is available in Epic.   Physical exam: Breasts Breasts symmetrical. No skin abnormalities bilateral breasts. No nipple retraction bilateral breasts. No nipple discharge bilateral breasts. No lymphadenopathy. No lumps palpated bilateral breasts.       Pelvic/Bimanual Pap is not indicated today    Smoking History: Patient has never smoked not referred to quit line.    Patient Navigation: Patient education provided. Access to services provided for patient through [facility] program. Spanish interpreter provided. No transportation provided   Colorectal Cancer Screening: Per patient has never had colonoscopy completed No complaints today.    Breast and Cervical Cancer Risk Assessment: Patient does not have family history of breast cancer, known genetic mutations, or radiation treatment to the chest before age 83. Patient does not have history of cervical dysplasia, immunocompromised, or DES exposure in-utero.  Risk Assessment    Risk Scores      07/22/2019   Last edited by: Narda Rutherford, LPN   5-year risk: 0.6 %   Lifetime risk: 5.2 %          A: BCCCP exam without pap smear Complaint of none.  P: [facility] to the [facility] for a screening mammogram. Appointment scheduled 07/22/2019. BP today 150/98, pt advised to f/u with PCP, discussed warning signs of hypertensive urgency/emergency that warrant immediate attention at an emergency department. Pt verbalizes understanding. Pt reports she has not been diagnosed with high blood pressure and did have an elevated blood pressure reading one other time, but her repeat blood pressure was normal and she did not have any  additional follow-up after that time. Discussed negative sequelae of untreated high blood pressure including kidney damage, heart attack, stroke. Patient referred to [facility] program.  Marylen Ponto, NP 07/22/2019 9:44 AM

## 2019-07-22 NOTE — Patient Instructions (Signed)
Hipertensin en los adultos Hypertension, Adult La presin arterial alta (hipertensin) se produce cuando la fuerza de la sangre bombea a travs de las arterias con mucha fuerza. Las arterias son los vasos sanguneos que transportan la sangre desde el corazn al resto del cuerpo. La hipertensin hace que el corazn haga ms esfuerzo para bombear sangre y puede provocar que las arterias se estrechen o endurezcan. La hipertensin no tratada o no controlada puede causar infarto de miocardio, insuficiencia cardaca, accidente cerebrovascular, enfermedad renal y otros problemas. Una lectura de la presin arterial consta de un nmero ms alto sobre un nmero ms bajo. En condiciones ideales, la presin arterial debe estar por debajo de 120/80. El primer nmero ("superior") es la presin sistlica. Es la medida de la presin de las arterias cuando el corazn late. El segundo nmero ("inferior") es la presin diastlica. Es la medida de la presin en las arterias cuando el corazn se relaja. Cules son las causas? Se desconoce la causa exacta de esta afeccin. Hay algunas afecciones que causan presin arterial alta o estn relacionadas con ella. Qu incrementa el riesgo? Algunos factores de riesgo de hipertensin estn bajo su control. Los siguientes factores pueden hacer que sea ms propenso a desarrollar esta afeccin:  Fumar.  Tener diabetes mellitus tipo 2, colesterol alto, o ambos.  No hacer la cantidad suficiente de actividad fsica o ejercicio.  Tener sobrepeso.  Consumir mucha grasa, azcar, caloras o sal (sodio) en su dieta.  Beber alcohol en exceso. Algunos factores de riesgo para la presin arterial alta pueden ser difciles o imposibles de cambiar. Algunos de estos factores son los siguientes:  Tener enfermedad renal crnica.  Tener antecedentes familiares de presin arterial alta.  Edad. Los riesgos aumentan con la edad.  Raza. El riesgo es mayor para las personas  afroamericanas.  Sexo. Antes de los 45aos, los hombres corren ms riesgo que las mujeres. Despus de los 65aos, las mujeres corren ms riesgo que los hombres.  Tener apnea obstructiva del sueo.  Estrs. Cules son los signos o los sntomas? Es posible que la presin arterial alta puede no cause sntomas. La presin arterial muy alta (crisis hipertensiva) puede provocar:  Dolor de cabeza.  Ansiedad.  Falta de aire.  Hemorragia nasal.  Nuseas y vmitos.  Cambios en la visin.  Dolor de pecho intenso.  Convulsiones. Cmo se diagnostica? Esta afeccin se diagnostica al medir su presin arterial mientras se encuentra sentado, con el brazo apoyado sobre una superficie plana, las piernas sin cruzar y los pies bien apoyados en el piso. El brazalete del tensimetro debe colocarse directamente sobre la piel de la parte superior del brazo y al nivel de su corazn. Debe medirla al menos dos veces en el mismo brazo. Determinadas condiciones pueden causar una diferencia de presin arterial entre el brazo izquierdo y el derecho. Ciertos factores pueden provocar que las lecturas de la presin arterial sean inferiores o superiores a lo normal por un perodo corto de tiempo:  Si su presin arterial es ms alta cuando se encuentra en el consultorio del mdico que cuando la mide en su hogar, se denomina "hipertensin de bata blanca". La mayora de las personas que tienen esta afeccin no deben ser medicadas.  Si su presin arterial es ms alta en el hogar que cuando se encuentra en el consultorio del mdico, se denomina "hipertensin enmascarada". La mayora de las personas que tienen esta afeccin deben ser medicadas para controlar la presin arterial. Si tiene una lecturas de presin arterial alta durante   una visita o si tiene presin arterial normal con otros factores de riesgo, se le podr pedir que haga lo siguiente:  Que regrese otro da para volver a Chief Technology Officer su presin arterial  nuevamente.  Que se controle la presin arterial en su casa durante 1 semana o ms. Si se le diagnostica hipertensin, es posible que se le realicen otros anlisis de sangre o estudios de diagnstico por imgenes para ayudar a su mdico a comprender su riesgo general de tener otras afecciones. Cmo se trata? Esta afeccin se trata haciendo cambios saludables en el estilo de vida, tales como ingerir alimentos saludables, realizar ms ejercicio y reducir el consumo de alcohol. El mdico puede recetarle medicamentos si los cambios en el estilo de vida no son suficientes para Child psychotherapist la presin arterial y si:  Su presin arterial sistlica est por encima de 130.  Su presin arterial diastlica est por encima de 80. La presin arterial deseada puede variar en funcin de las enfermedades, la edad y otros factores personales. Siga estas instrucciones en su casa: Comida y bebida   Siga una dieta con alto contenido de fibras y [place], y con bajo contenido de sodio, Location manager agregada y Physicist, medical. Un ejemplo de plan alimenticio es la dieta DASH (Dietary Approaches to Stop Hypertension, Mtodos alimenticios para detener la hipertensin). Para alimentarse de esta manera: ? Coma mucha fruta y [place]. Trate de que la mitad del plato de cada comida sea de frutas y verduras. ? Coma cereales integrales, como pasta integral, arroz integral o pan integral. Llene aproximadamente un cuarto del plato con cereales integrales. ? Coma y beba productos lcteos con bajo contenido de grasa, como leche descremada o yogur bajo en grasas. ? Evite la ingesta de cortes de carne grasa, carne procesada o curada, y carne de ave con piel. Llene aproximadamente un cuarto del plato con protenas magras, como pescado, pollo sin piel, frijoles, huevos o tofu. ? Evite ingerir alimentos prehechos y procesados. En general, estos tienen mayor cantidad de sodio, azcar agregada y Wendee Copp.  Reduzca su ingesta diaria de sodio.  La mayora de las personas que tienen hipertensin deben comer menos de 1500 mg de sodio por [employer].  No beba alcohol si: ? Su mdico le indica no hacerlo. ? Est embarazada, puede estar embarazada o est tratando de quedar embarazada.  Si bebe alcohol: ? Limite la cantidad que bebe a lo siguiente:  De 0 a 1 medida por da para las mujeres.  De 0 a 2 medidas por da para los hombres. ? Est atento a la cantidad de alcohol que hay en las bebidas que toma. En los [place], una medida equivale a una botella de cerveza de 12oz (376ml), un vaso de vino de 5oz (184ml) o un vaso de una bebida alcohlica de alta graduacin de 1oz (50ml). Estilo de vida   Trabaje con su mdico para mantener un peso saludable o Administrator, Civil Service. Pregntele cul es el peso recomendado para usted.  Haga al menos 56minutos de ejercicio la [employer] de la [place]. Estas actividades pueden incluir caminar, nadar o andar en bicicleta.  Incluya ejercicios para fortalecer sus msculos (ejercicios de resistencia), como [place] o levantamiento de pesas, como parte de su rutina semanal de ejercicios. Intente realizar 22minutos de este tipo de ejercicios al [employer] Corporation a la [place].  No consuma ningn producto que contenga nicotina o tabaco, como cigarrillos, cigarrillos electrnicos y tabaco de Higher education careers adviser. Si necesita ayuda para dejar de fumar, consulte al  mdico.  Contrlese la presin arterial en su casa segn las indicaciones del mdico.  Concurra a todas las visitas de seguimiento como se lo haya indicado el mdico. Esto es importante. Medicamentos  [employer] de venta libre y los recetados solamente como se lo haya indicado el mdico. Siga cuidadosamente las indicaciones. Los medicamentos para la presin arterial deben tomarse segn las indicaciones.  No omita las dosis de medicamentos para la presin arterial. Si lo hace, estar en riesgo de tener problemas y puede hacer que los medicamentos  sean menos eficaces.  Pregntele a su mdico a qu efectos secundarios o reacciones a los Museum/gallery curator. Comunquese con un mdico si:  Piensa que tiene una reaccin a un medicamento que est tomando.  Tiene dolores de cabeza frecuentes (recurrentes).  Se siente mareado.  Tiene hinchazn en los tobillos.  Tiene problemas de visin. Solicite ayuda inmediatamente si:  Siente un dolor de cabeza intenso o confusin.  Siente debilidad inusual o adormecimiento.  Siente que va a desmayarse.  Siente un dolor intenso en el pecho o el abdomen.  Vomita repetidas veces.  Tiene dificultad para respirar. Resumen  La hipertensin se produce cuando la sangre bombea en las arterias con mucha fuerza. Si esta afeccin no se controla, podra correr riesgo de tener complicaciones graves.  La presin arterial deseada puede variar en funcin de las enfermedades, la edad y otros factores personales. Para la [employer], una presin arterial normal es menor que 120/80.  La hipertensin se trata con cambios en el estilo de vida, medicamentos o una combinacin de [place]. Los Danaher Corporation estilo de vida incluyen prdida de peso, ingerir alimentos sanos, seguir una dieta baja en sodio, hacer ms ejercicio y Glass blower/designer consumo de alcohol. Esta informacin no tiene Theme park manager el consejo del mdico. Asegrese de hacerle al mdico cualquier pregunta que tenga. Document Revised: 02/05/2018 Document Reviewed: 02/05/2018 Elsevier Patient Education  2020 Elsevier Inc. Autoexamen de [employer] Breast Self-Awareness Autoexaminarse las mamas significa familiarizarse con el aspecto y la sensacin de las mamas al tacto. Incluye revisarse las mamas habitualmente e informarle al mdico acerca de cualquier cambio. Es importante autoexaminarse las [place]. En ocasiones, los cambios pueden no ser perjudiciales (son benignos), pero a veces un cambio en las mamas puede ser un signo de un  problema mdico grave. Es importante aprender a Primary school teacher procedimiento de modo correcto para que pueda [employer] Beyond problemas de [place] temprana, cuando es ms probable que el tratamiento resulte exitoso. Todas las mujeres deben autoexaminarse las [place], incluso aquellas que se sometieron a implantes mamarios. Lo que necesita:  Un espejo.  Una habitacin bien iluminada. Cmo realizar el autoexamen de mamas Un autoexamen de mamas es una forma de aprender qu es normal para sus mamas y si sufren modificaciones. Para hacer un autoexamen de las mamas: Busque cambios  1. Qutese toda la ropa por encima de la cintura. 2. Prese frente a un espejo en una habitacin con buena iluminacin. 3. Apoye las manos en las caderas. 4. Empuje con fuerza hacia abajo con las manos. 5. Compare las mamas en el espejo. Busque diferencias entre ellas (asimetra), por ejemplo: ? Diferencias en la forma. ? Diferencias en el tamao. ? Pliegues, depresiones y ndulos en Deborha Payment, y no en la otra. 6. Observe cada mama para buscar cambios en la piel, por ejemplo: ? Enrojecimiento. ? Zonas escamosas. 7. Observe si hay cambios en los pezones, por ejemplo: ? Secrecin. ? Sangrado. ?  Hoyuelos. ? Enrojecimiento. ? Un cambio en la posicin. [place] si hay cambios [place] las mamas con cuidado para detectar ndulos y [place]. Lo mejor es hacerlo mientras est acostada boca arriba en el piso y nuevamente mientras est sentada o de pie en la ducha o la baera con agua jabonosa en la piel. Plpese cada mama de la siguiente forma: 1. Coloque el brazo del lado de la mama que se examina por arriba de la cabeza. 2. Plpese la mama con la Liliana C[place]. 3. Comience en la zona del pezn y haga crculos superpuestos de de pulgada (2cm). Para hacerlo, use las yemas de los tres dedos del Center. Ejerza una presin [place], luego mediana y [place]. La presin Industrial/product designer el tejido ms cercano a la piel. La presin  mediana le permitir palpar el tejido que est un poco ms profundo. La presin Advertising account executive el tejido ms cercano a las costillas. 4. Continuar superponiendo crculos y vaya hacia abajo, hasta sentir las [place], por debajo del [place]. 5. Desplcese a una distancia del ancho de un dedo hacia el centro del cuerpo. Siga con los crculos superpuestos de de pulgada (2cm) para palpar la mama, mientras asciende lentamente hacia la clavcula. 6. Contine con el examen hacia arriba y [place] abajo con las tres presiones, Librarian, academic a Insurance risk surveyor.  Anote sus hallazgos Anotar lo que encuentra puede ayudarla a recordar qu debe consultar con el mdico. [place] los siguientes datos:  Qu es normal para cada mama.  Cualquier cambio que encuentre en cada mama, por ejemplo: ? La clase de cambios que encuentra. ? Dolor o sensibilidad. ? Si hay bultos, su tamao y [place].  En qu momento se encuentra del ciclo menstrual, si usted todava est menstruando. Recomendaciones y consejos generales  Examnese las [employer] Foods.  Si est amamantando, el mejor momento para examinarse las mamas es despus de Economist o de usar un Engineer, petroleum.  Si menstra, el mejor momento para examinarse las [place] es 5 a 7das despus del perodo menstrual. Durante el perodo menstrual, las mamas en general tienen ms bultos, y tal vez sea ms difcil percibir los [place].  Con el tiempo y Designer, jewellery, se familiarizar con las variaciones de las mamas y se sentir ms cmoda con Visual merchandiser. Comunquese con un mdico si:  Observa un cambio en la forma o el tamao de las mamas o los pezones.  Observa un cambio en la piel de las mamas o los pezones, como la piel enrojecida o escamosa.  Tiene una secrecin anormal proveniente de los pezones.  Encuentra un ndulo o una zona engrosada que no tena antes.  Tiene dolor en las [place].  Tiene alguna inquietud relacionada con la salud de la  mama. Resumen  El autoexamen de mamas incluye buscar cambios fsicos en las [place], y tambin palpar para Actuary cambio en las mamas.  El autoexamen de mamas debe hacerse frente a un espejo en una habitacin bien iluminada.  Debe examinarse las [employer] Foods. Si menstra, el mejor momento para examinarse las [place] es de 5 a 7das despus del perodo menstrual.  Informe al mdico si nota cambios en las mamas, como cambios en el tamao, cambios en la piel, dolor o sensibilidad, o un lquido inusual que sale de los pezones. Esta informacin no tiene Marine scientist el consejo del mdico. Asegrese de hacerle al mdico cualquier pregunta que tenga. Document Revised: 01/20/2018 Document Reviewed: 01/20/2018 Elsevier Patient Education  2020 Elsevier Inc.  

## 2019-08-04 ENCOUNTER — Inpatient Hospital Stay: Payer: Self-pay | Attending: Obstetrics and Gynecology | Admitting: *Deleted

## 2019-08-04 ENCOUNTER — Other Ambulatory Visit: Payer: Self-pay | Admitting: Obstetrics and Gynecology

## 2019-08-04 ENCOUNTER — Other Ambulatory Visit: Payer: Self-pay

## 2019-08-04 VITALS — BP 138/98 | Temp 97.3°F | Ht 67.0 in | Wt 171.0 lb

## 2019-08-04 DIAGNOSIS — Z0000 Encounter for general adult medical examination without abnormal findings: Secondary | ICD-10-CM

## 2019-08-04 NOTE — Progress Notes (Signed)
Wisewoman initial screening   interpreterPatsy Lager [phone] ([place])   Clinical Measurement:  Height: 67 in Weight: 171 lb  Blood Pressure: 140/98  Blood Pressure #2: 138/98 Fasting Labs Drawn Today, will review with patient when they result.   Medical History:  Patient states that she does have a history of high blood pressure. Patient does not have a history of high cholesterol or diabetes.  Medications:  Patient states that she takes medication to lower blood pressure. Patient does not take medication to lower cholesterol or diabetes. Patient does not take an aspirin a day to help prevent a heart attack or stroke. During the past 7 days patient has taken prescribed medication to lower blood pressure on all 7 days.   Blood pressure, self measurement: Patient states that she does not take measure blood pressure from home and has not been told to do so by a healthcare provider.   Nutrition: Patient states that on average she eats 1 cup of fruit and 0 cups of vegetables per day. Patient states that she does not eat fish at least 2 times per week. Patient eats less than half servings of whole grains. Patient drinks less than 36 ounces of beverages with added sugar weekly. Patient is not currently watching sodium or salt intake. In the past 7 days patient has not had any drinks containing alcohol. On average patient does not drink any drinks containing alcohol.      Physical activity:  Patient states that she gets 0 minutes of moderate and 0 minutes of vigorous physical activity each week.  Smoking status:  Patient states that she has never smoked tobacco.   Quality of life:  Over the past 2 weeks patient states that she has not had any days where she has little interest or pleasure in doing things and 0 days where she has felt down, depressed or hopeless.    Risk reduction and counseling:    Health Coaching: Spoke with patient about adding more fruits and vegetables into daily diet. Explained  that the recommendation is for 2 cups of fruit and 3 cups of vegetables daily. Showed patient what a serving would look like. Encouraged patient to also add more heart healthy fish and whole grains into diet. Encouraged patient to also start watching salt intake given her history of hypertension. Also encouraged patient to try and start exercising for 20 minutes a day.   Heart Wise: Explained Heart Wise program to patient. Gave paperwork to patient. Gave blood pressure monitor to patient. Showed patient how to take blood pressure and let patient practice taking blood pressure. Gave information to patient about hypertension. Spoke with patient about medication adherence. Will call patient on 08/19/19 to get first 2 weeks of blood pressure readings. Answered any questions that patient had regarding taking and tracking blood pressure.   Navigation:  I will notify patient of lab results.  Patient is aware of 2 more health coaching sessions and a follow up. Will call patient with follow-up appointment information with Internal Medicine once appointment is scheduled for elevated BP.  Time: 30 minutes

## 2019-08-05 LAB — GLUCOSE, RANDOM: Glucose: 119 mg/dL — ABNORMAL HIGH (ref 65–99)

## 2019-08-05 LAB — LIPID PANEL W/O CHOL/HDL RATIO
Cholesterol, Total: 194 mg/dL (ref 100–199)
HDL: 55 mg/dL (ref >39)
LDL Chol Calc (NIH): 117 mg/dL — ABNORMAL HIGH (ref 0–99)
Triglycerides: 124 mg/dL (ref 0–149)
VLDL Cholesterol Cal: 22 mg/dL (ref 5–40)

## 2019-08-05 LAB — HGB A1C W/O EAG: Hgb A1c MFr Bld: 6.1 % — ABNORMAL HIGH (ref 4.8–5.6)

## 2019-08-10 NOTE — Progress Notes (Signed)
Thank you! She will be getting a free follow-up visit with a PCP through the [facility] program.

## 2019-08-16 NOTE — Progress Notes (Signed)
Attestation of Attending Supervision of Resident: Evaluation and management procedures were performed by the [facility] Medicine Resident under my supervision. I was immediately available for direct supervision, assistance and direction throughout this encounter.  I also confirm that I have verified the information documented in the resident's note, and that I have also personally reperformed the pertinent components of the physical exam and all of the medical decision making activities.  I have also made any necessary editorial changes.  Catalina Antigua, MD Attending Obstetrician & Gynecologist, [facility] for [facility], [facility] 08/16/2019 11:01 AM

## 2019-08-24 ENCOUNTER — Encounter: Payer: Self-pay | Admitting: Internal Medicine

## 2019-08-24 ENCOUNTER — Ambulatory Visit (INDEPENDENT_AMBULATORY_CARE_PROVIDER_SITE_OTHER): Payer: Self-pay | Admitting: Internal Medicine

## 2019-08-24 ENCOUNTER — Other Ambulatory Visit: Payer: Self-pay

## 2019-08-24 VITALS — BP 124/73 | HR 70 | Temp 97.9°F | Ht 63.0 in | Wt 175.6 lb

## 2019-08-24 DIAGNOSIS — R7303 Prediabetes: Secondary | ICD-10-CM

## 2019-08-24 DIAGNOSIS — E7800 Pure hypercholesterolemia, unspecified: Secondary | ICD-10-CM

## 2019-08-24 DIAGNOSIS — Z79899 Other long term (current) drug therapy: Secondary | ICD-10-CM

## 2019-08-24 DIAGNOSIS — E669 Obesity, unspecified: Secondary | ICD-10-CM

## 2019-08-24 DIAGNOSIS — Z6831 Body mass index (BMI) 31.0-31.9, adult: Secondary | ICD-10-CM

## 2019-08-24 DIAGNOSIS — I10 Essential (primary) hypertension: Secondary | ICD-10-CM | POA: Insufficient documentation

## 2019-08-24 MED ORDER — LISINOPRIL 20 MG PO TABS: 20.0000 mg | ORAL_TABLET | Freq: Every day | ORAL | 2 refills | Status: DC

## 2019-08-24 MED FILL — LISINOPRIL 20 MG TABLET: 20 | 30 days supply | Qty: 30 | Fill #0

## 2019-08-24 NOTE — Progress Notes (Signed)
New Patient Office Visit  Subjective:  Patient ID: Michelle Clay, female    DOB: [birth year]  Age: 49 y.o. MRN: [id number]  CC:  Chief Complaint  Patient presents with  . Establish Care  . Diabetes  . Medication Refill    Lisinopril    HPI Michelle Clay presents for HTN, Prediabetes, HLD  Past Medical History:  Diagnosis Date  . Acid reflux   . Hypertension     History reviewed. No pertinent surgical history.  Family History  Problem Relation Age of Onset  . Hypertension Mother   . Hypertension Sister     Social History   Socioeconomic History  . Marital status: Married    Spouse name: Not on file  . Number of children: 2  . Years of education: Not on file  . Highest education level: 12th grade  Occupational History  . Not on file  Tobacco Use  . Smoking status: Never Smoker  . Smokeless tobacco: Never Used  Substance and Sexual Activity  . Alcohol use: No  . Drug use: Never  . Sexual activity: Yes    Birth control/protection: Condom  Other Topics Concern  . Not on file  Social History Narrative  . Not on file   Social Determinants of Health   Financial Resource Strain:   . Difficulty of Paying Living Expenses:   Food Insecurity:   . Worried About Charity fundraiser in the Last Year:   . Arboriculturist in the Last Year:   Transportation Needs: No Transportation Needs  . Lack of Transportation (Medical): No  . Lack of Transportation (Non-Medical): No  Physical Activity:   . Days of Exercise per Week:   . Minutes of Exercise per Session:   Stress:   . Feeling of Stress :   Social Connections:   . Frequency of Communication with Friends and Family:   . Frequency of Social Gatherings with Friends and Family:   . Attends Religious Services:   . Active Member of Clubs or Organizations:   . Attends Archivist Meetings:   Marland Kitchen Marital Status:   Intimate Partner Violence:   . Fear of Current or Ex-Partner:   . Emotionally Abused:   Marland Kitchen  Physically Abused:   . Sexually Abused:     ROS Review of Systems  Constitutional: Negative for fever.  HENT: Negative for facial swelling.   Respiratory: Negative for shortness of breath.   Gastrointestinal: Negative for diarrhea.  Endocrine: Negative for cold intolerance.  Genitourinary: Negative for dysuria.  Musculoskeletal: Negative for arthralgias.  Neurological: Negative for dizziness.  Psychiatric/Behavioral: Negative for agitation.    Objective:   Today's Vitals: BP 124/73 (BP Location: Left Arm, Patient Position: Sitting, Cuff Size: Normal)   Pulse 70   Temp 97.9 F (36.6 C) (Oral)   Ht 5\' 3"  (1.6 m)   Wt 175 lb 9.6 oz (79.7 kg)   LMP 06/19/2019   SpO2 100% Comment: room air  BMI 31.11 kg/m   Physical Exam Constitutional:      General: She is not in acute distress.    Appearance: She is not diaphoretic.  Cardiovascular:     Rate and Rhythm: Normal rate and regular rhythm.     Heart sounds: Normal heart sounds. No murmur. No friction rub. No gallop.   Pulmonary:     Effort: Pulmonary effort is normal. No respiratory distress.     Breath sounds: Normal breath sounds. No wheezing or rales.  Chest:  Chest wall: No tenderness.  Abdominal:     General: Bowel sounds are normal. There is no distension.     Palpations: Abdomen is soft. There is no mass.     Tenderness: There is no abdominal tenderness. There is no guarding or rebound.  Neurological:     Mental Status: She is alert.     Assessment & Plan:   Problem List Items Addressed This Visit      Cardiovascular and Mediastinum   Essential hypertension - Primary    Started on lisinopril 20mg  one month ago due to hypertension observed during wise woman program visits.  She is tolerating lisinopril well.  BP well within goal today.    -refill lisinopril -check bmp -3 month follow up      Relevant Medications   lisinopril (ZESTRIL) 20 MG tablet   Other Relevant Orders   BMP8+Anion Gap     Other    Prediabetes    A1C 6.1% indicating prediabetes.  She is obese.    -she will begin with diet and exercise and we will follow her progress       Pure hypercholesterolemia    ASCVD risk 1.1%.  Good HDL value, normal TG's.    -she will begin the mediterranean diet, increase exercise and we will follow      Relevant Medications   lisinopril (ZESTRIL) 20 MG tablet      Outpatient Encounter Medications as of 08/24/2019  Medication Sig  . amoxicillin (AMOXIL) 500 MG capsule Take 2 capsules (1,000 mg total) by mouth 2 (two) times daily. (Patient not taking: Reported on 08/04/2019)  . clarithromycin (BIAXIN) 500 MG tablet Take 1 tablet (500 mg total) by mouth 2 (two) times daily. (Patient not taking: Reported on 08/04/2019)  . lisinopril (ZESTRIL) 20 MG tablet Take 1 tablet (20 mg total) by mouth daily.  08/06/2019 omeprazole (PRILOSEC) 20 MG capsule Take 1 capsule (20 mg total) by mouth 2 (two) times daily.  . ondansetron (ZOFRAN) 4 MG tablet Take 1 tablet (4 mg total) by mouth every 8 (eight) hours as needed for nausea or vomiting. (Patient not taking: Reported on 08/04/2019)  . [DISCONTINUED] lisinopril (ZESTRIL) 20 MG tablet Take 20 mg by mouth daily.   No facility-administered encounter medications on file as of 08/24/2019.    Follow-up: Return in about 3 months (around 11/23/2019).   11/25/2019, MD

## 2019-08-24 NOTE — Assessment & Plan Note (Addendum)
Started on lisinopril 20mg  one month ago due to hypertension observed during wise woman program visits.  She is tolerating lisinopril well.  BP well within goal today.    -refill lisinopril -check bmp -3 month follow up

## 2019-08-24 NOTE — Assessment & Plan Note (Signed)
A1C 6.1% indicating prediabetes.  She is obese.    -she will begin with diet and exercise and we will follow her progress

## 2019-08-24 NOTE — Assessment & Plan Note (Signed)
ASCVD risk 1.1%.  Good HDL value, normal TG's.    -she will begin the mediterranean diet, increase exercise and we will follow

## 2019-08-24 NOTE — Patient Instructions (Addendum)
Michelle Clay. He adjuntado un plan de dieta y ejercicio. Hicimos anlisis de laboratorio hoy y te Freight forwarder para darte los [place]. Haga un seguimiento en 3 meses con su mdico de atencin primaria.   Dieta mediterrnea Mediterranean Diet El trmino "dieta mediterrnea" hace referencia a elecciones de alimentacin y de estilo de vida basadas en las tradiciones de los pases ubicados en las costas del mar Mediterrneo. Se ha demostrado que esta dieta ayuda a prevenir determinadas afecciones y a [employer]oco[employer] que padecen enfermedades crnicas, como enfermedades renales y cardacas. Consejos para seguir Advertising account executive de vida  Cocine y coma en familia, cuando sea posible.  Beba suficiente lquido como para mantener la orina clara o de color amarillo plido.  Haga actividad fsica [employer]. Esto incluye lo siguiente: ? Ejercicios aerbicos, como nadar o correr. ? Actividades recreativas, como jardinera, caminatas o tareas domsticas.  Intente dormir de 7 a 8horas todas las noches.  Si el mdico se lo recomienda, consuma vino tinto con moderacin. Esto significa 1vaso por da para mujeres que no estn embarazadas y 2vasos por da para los hombres. Un vaso de vino equivale a 5oz ( ). Leer las etiquetas de los alimentos   Verificar el tamao de la porcin de los alimentos envasados. En el caso del arroz y la pasta, el tamao de la porcin se refiere a la cantidad de alimento cocido, no seco.  Administrator de las grasas totales de los alimentos envasados. Evite los que contienen grasas saturadas o trans.  Lea la lista de ingredientes para determinar si los alimentos contienen azcares agregados, como jarabe de maz. De compras  En el supermercado, compre la [employer] de los alimentos en las reas cercanas a las paredes del edificio. Esto incluye lo siguiente: ? Nils Pyle y verduras frescas. ? Cereales, frijoles, frutos secos y semillas. Algunos de  estos productos pueden estar disponibles sueltos o en grandes cantidades (a granel). ? Estate agent. ? Aves y [place]. ? Productos lcteos descremados.  Compre ingredientes integrales en lugar de alimentos preenvasados.  Compre frutas y verduras de estacin frescas en mercados de granjeros locales.  Compre frutas y verduras congeladas en bolsas hermticas.  Si no tiene acceso a Estate agent de buena calidad, compre camarones congelados precocidos o pescado en lata, como atn, salmn o sardinas.  Compre pequeas cantidades de verduras crudas o cocidas, ensaladas o aceitunas en la seccin de ensaladas o de charcutera de la tienda.  Surta su despensa para tener siempre determinados productos a mano, por ejemplo, aceite de oliva, atn en lata, tomates en lata, arroz, pasta y frijoles. Coccin  Cocine con aceite de oliva extra virgen en lugar de usar mantequilla u otros aceites vegetales.  Use las carnes como guarnicin y las verduras o los cereales como plato principal. Esto significa consumir porciones pequeas de carne o agregarla a la pasta o a los guisos en pequeas cantidades.  Use frijoles o verduras en lugar de carne en platos comunes como chili o lasaa.  Experimente con diferentes mtodos de coccin. Intente asar las verduras en lugar de cocerlas al vapor o saltearlas.  Agregue verduras congeladas a sopas, guisos, pasta o arroz.  Agregue frutos secos o semillas para cubrir la porcin de grasas saludables en cada comida. Puede agregarlos a yogures, ensaladas o platos con verduras.  Marine el pescado o las verduras con aceite de [place], [place] de [place], ajo y Science writer. Planificacin de las comidas   Planifique 1comida vegetariana por da  todas las semanas. Si es posible, intente agregar hasta 2.  Coma mariscos 2o ms veces por semana.  Tenga a mano bocadillos saludables, por ejemplo: ? Bastones de verduras con humus. ? Yogur griego. ? Mezcla de frutos secos y  frutas.  Consuma comidas equilibradas toda la semana. Esto incluye lo siguiente: ? Frutas: 2 a 3porciones por da ? Verduras: 4 a 5porciones por da ? Lcteos con bajo contenido de grasa: 2porciones por da ? Pescado, aves o carne magra: 1porcin por da ? Frijoles y legumbres: 2porciones o ms por semana ? Frutos secos y semillas: 1 a 2porciones por da ? Cereales integrales: 6 a 8porciones por da ? Aceite de oliva extra virgen: 3 a 4porciones por da  Limite las carnes rojas y los dulces a solo unas porciones al mes. Qu alimentos elijo?  Dieta mediterrnea ? Recomendados  Cereales: Pastas integrales. Arroz integral. Gavin Potters burgol. Polenta. Cuscs. Pan integral. Avena. Quinua.  Verduras: Alcachofas. Remolachas. Brcoli. Repollo. Zanahorias. Augustin Coupe. Judas verdes. Acelga. Col rizada. Espinaca. Cebollas. Puerro. Guisantes. Calabaza. Tomates. Pimientos. Rbanos.  Lambert ModyOkey Regal. Damascos. Aguacate. Frutos rojos. Bananas. Cerezas. Dtiles. Higos. Uvas. Limones. Meln. Naranjas. Duraznos. Ciruelas. [place].  Carnes y otros alimentos ricos en protenas: Frijoles. Almendras. Semillas de girasol. Piones. Manes. Bacalao. Salmn. Vieiras. Camarones. Atn. Tilapia. Almejas. Ostras. Huevos.  Lcteos: Leche con bajo contenido de [place]. Queso. Yogur griego.  BebidasGrayce Sessions. Vino tinto. T de hierbas.  Grasas y aceites: Aceite de oliva extra virgen. Aceite de aguacate. Aceite de pepitas de uva.  Dulces y postres: Yogur griego con miel. Manzanas asadas. Peras asadas. Mezcla de frutos secos.  Condimentos y otros alimentos: [place]. Cilantro. Coriandro. Comino. Menta. Perejil. Salvia. Westley Hummer. Estragn. Ajo. Organo. Tomillo. Pimienta. [place]. Hummus. Salsa de tomate. Aceitunas. Hongos. ? Limite  Cereales: Comidas con arroz o pasta preenvasada. Cereales preenvasados con azcar agregada.  Verduras: Patatas fritas.  Frutas: Frutas enlatadas con  almbar.  Carnes y otros alimentos ricos en protenas: Carne de vaca. Cerdo. Cordero. Carne de ave con piel. Perros calientes. Tocino.  Lcteos: Helados. Rite Aid. Leche entera.  Bebidas: Jugos. Refrescos endulzados con azcar. Cerveza. Licores y bebidas espirituosas.  Grasas y aceites: Mantequilla. Aceite de canola. Aceite vegetal. Grasa de carne de res. Martin.  Dulces y postres: Administrator. Bizcochuelos. Pasteles. Caramelos.  Condimentos y otros alimentos: Mayones[place]. Salsas y adobos listos para consumir. Esta podra no ser [employer]. Hable con el nutricionista sobre las opciones de alimentos ms adecuadas para usted. Resumen  La dieta mediterrnea implica elecciones de alimentos y de estilo de vida.  Consuma una variedad de frutas y verduras frescas, frijoles, frutos secos, semillas y cereales integrales.  Limite la cantidad de carnes rojas y dulces.  Pregntele al mdico si puede beber vino tinto con moderacin. Esto significa 1vaso por da para mujeres que no estn embarazadas y 2vasos por da para los hombres. Un vaso de vino equivale a 5oz (162ml). Esta informacin no tiene Marine scientist el consejo del mdico. Asegrese de hacerle al mdico cualquier pregunta que tenga. Document Revised: 12/09/2016 Elsevier Patient Education  [place] para [employer] sano Exercising to Stay Healthy Para estar sano y [place] as, es recomendable hacer ejercicio de intensidad moderada y de intensidad [place]. Puede saber si est haciendo ejercicio de intensidad moderada si su corazn comienza a latir ms rpido y su respiracin se vuelve ms rpida, pero an [place]. Puede saber que est haciendo ejercicio de intensidad vigorosa si  respira con mucha ms dificultad y rapidez, y no puede Diplomatic Services operational officer. Hacer actividad fsica con regularidad es muy importante. Tiene muchos otros beneficios, como por  ejemplo:  Mejora el estado fsico general, la flexibilidad y la resistencia.  Aumenta la densidad sea.  Ayuda a Art gallery manager.  Disminuye la Art gallery manager.  Aumenta la fuerza muscular.  Reduce el estrs y las tensiones.  Mejora el estado de salud general. Con qu frecuencia debera hacer ejercicio? Elija una actividad que disfrute y establezca objetivos realistas. El mdico puede ayudarlo a Event organiser un plan de actividades que funcione para usted. Haga actividad fsica habitualmente como se lo haya indicado el mdico. Esto puede incluir lo siguiente:  Hacer ejercicios de fortalecimiento muscular dos veces a la semana, como: ? Levantamiento de pesas. ? Usar bandas elsticas de resistencia. ? Flexiones de [employer]. ? Abdominales. ? Yoga.  Realizar ejercicio de [place] cierta intensidad durante una cantidad determinada de [place]. Elija entre estas opciones: ? Un total de de ejercicio de intensidad moderada cada semana. ? Un total de de ejercicio de intensidad vigorosa cada semana. ? Burlene Arnt de ejercicio de intensidad moderada y vigorosa cada semana. Los nios, las mujeres [place], las personas que no han hecho actividad fsica con regularidad, las personas que tienen sobrepeso y los adultos mayores tal vez tengan que consultar a un mdico sobre qu actividades son seguras para Radio producer. Si tiene [employer], asegrese de Science writer al mdico antes de comenzar un programa de ejercicios nuevo. Cules son algunas ideas para hacer ejercicio? Algunas ideas de ejercicio de intensidad moderada incluyen:  Caminar 1 milla (1.6 kilmetros) en aproximadamente 15 minutos.  Andar en bicicleta.  Practicar senderismo.  Jugar al golf.  Bailar.  Gimnasia acutica. Algunas ideas de ejercicio de intensidad vigorosa incluyen:  Caminar 4.5 millas (7.2 kilmetros) o ms en aproximadamente una hora.  Trotar o correr 5 millas (8 kilmetros) en aproximadamente  una hora.  Andar en bicicleta 10 millas (16,1 kilmetros) o ms en aproximadamente una hora.  Practicar natacin.  Practicar patinaje de ruedas o en lnea.  Hacer esqu de fondo.  Hacer deportes competitivos vigorosos, como ftbol americano, bsquet y ftbol.  Saltar la cuerda.  Tomar clases de baile [place] son algunas actividades diarias que pueden ayudarme a ejercitarme?  Trabajar en el jardn, como: ? Empujar una cortadora de csped. ? Juntar y embolsar hojas.  Lavar el automvil.  Empujar un cochecito.  Palear nieve.  Cuidar el jardn.  Lavar las ventanas o los pisos. Cmo puedo ser ms activo en mis actividades diarias?  Utilice las Microbiologist del ascensor.  Vaya a caminar durante su hora de almuerzo.  Si conduce, estacione el automvil ms lejos del trabajo o de la escuela.  Si [place] transporte pblico, bjese una parada antes y camine el resto del camino.  Pngase de pie o camine durante todas las llamadas telefnicas que haga mientras est adentro.  Levntese, estrese y camine cada a lo largo del Futures trader.  Haga ejercicio con Leisure centre manager. El apoyo para continuar haciendo ejercicio lo ayudar a Pharmacologist una rutina de actividad frecuente. Qu pautas puedo seguir mientras hago ejercicio?  Hable con el mdico antes de comenzar un programa nuevo de [place] fsica.  No haga ejercicio en exceso que pudiera hacer que se lastime, se sienta mareado o tenga dificultad para respirar.  Use ropa cmoda y calzado con buen soporte.  Beba gran cantidad de agua mientras hace ejercicio para evitar la deshidratacin  o los golpes de Airline pilot.  Haga ejercicio hasta que se aceleren su respiracin y sus latidos cardacos. Dnde encontrar ms informacin  Departamento de Salud y [address] de los [place] (U.S. Department of Health and Health and safety inspector): ThisPath.fi  Centros para Air traffic controller y Psychiatrist de Child psychotherapist for  Disease Control and Prevention, CDC): FootballExhibition.com.br Resumen  Hacer actividad fsica con regularidad es muy importante. Mejorar el estado fsico general, la flexibilidad y la resistencia.  El ejercicio regular tambin mejora la salud general. Fawn Kirk a controlar su peso, reducir el estrs y Scientist, clinical (histocompatibility and immunogenetics) la densidad sea.  No haga ejercicio en exceso que pudiera hacer que se lastime, se sienta mareado o tenga dificultad para respirar.  Hable con el mdico antes de comenzar un programa nuevo de [place] fsica. Esta informacin no tiene Theme park manager el consejo del mdico. Asegrese de hacerle al mdico cualquier pregunta que tenga. Document Revised: 05/31/2017 Document Reviewed: 05/31/2017 Elsevier Patient Education  2020 [employer].

## 2019-08-25 ENCOUNTER — Encounter: Payer: Self-pay | Admitting: Internal Medicine

## 2019-08-25 LAB — BMP8+ANION GAP
Anion Gap: 14 mmol/L (ref 10.0–18.0)
BUN/Creatinine Ratio: 18 (ref 9–23)
BUN: 10 mg/dL (ref 6–24)
CO2: 23 mmol/L (ref 20–29)
Calcium: 9.6 mg/dL (ref 8.7–10.2)
Chloride: 100 mmol/L (ref 96–106)
Creatinine, Ser: 0.56 mg/dL — ABNORMAL LOW (ref 0.57–1.00)
GFR calc Af Amer: 126 mL/min/{1.73_m2} (ref >59)
GFR calc non Af Amer: 109 mL/min/{1.73_m2} (ref >59)
Glucose: 114 mg/dL — ABNORMAL HIGH (ref 65–99)
Potassium: 4.2 mmol/L (ref 3.5–5.2)
Sodium: 137 mmol/L (ref 134–144)

## 2019-08-25 NOTE — Progress Notes (Signed)
Internal Medicine Clinic Attending  Case discussed with Dr. Winfrey  at the time of the visit.  We reviewed the resident's history and exam and pertinent patient test results.  I agree with the assessment, diagnosis, and plan of care documented in the resident's note.  

## 2019-11-16 MED FILL — LISINOPRIL 20 MG TABLET: 20 | 30 days supply | Qty: 30 | Fill #2

## 2019-11-22 ENCOUNTER — Ambulatory Visit (INDEPENDENT_AMBULATORY_CARE_PROVIDER_SITE_OTHER): Payer: Self-pay | Admitting: Internal Medicine

## 2019-11-22 ENCOUNTER — Encounter: Payer: Self-pay | Admitting: Internal Medicine

## 2019-11-22 ENCOUNTER — Encounter: Payer: No Typology Code available for payment source | Admitting: Internal Medicine

## 2019-11-22 ENCOUNTER — Other Ambulatory Visit: Payer: Self-pay | Admitting: Internal Medicine

## 2019-11-22 VITALS — BP 124/80 | HR 73 | Temp 98.3°F | Ht 63.0 in | Wt 174.2 lb

## 2019-11-22 DIAGNOSIS — R058 Other specified cough: Secondary | ICD-10-CM | POA: Insufficient documentation

## 2019-11-22 DIAGNOSIS — I10 Essential (primary) hypertension: Secondary | ICD-10-CM

## 2019-11-22 DIAGNOSIS — R05 Cough: Secondary | ICD-10-CM

## 2019-11-22 DIAGNOSIS — R0981 Nasal congestion: Secondary | ICD-10-CM

## 2019-11-22 MED ORDER — LISINOPRIL 20 MG PO TABS: 20.0000 mg | ORAL_TABLET | Freq: Every day | ORAL | 3 refills | Status: DC

## 2019-11-22 MED ORDER — OMEPRAZOLE 20 MG PO CPDR: 20.0000 mg | DELAYED_RELEASE_CAPSULE | Freq: Every day | ORAL | 3 refills | Status: DC

## 2019-11-22 MED ORDER — FLUTICASONE PROPIONATE 50 MCG/ACT NA SUSP: 1.0000 | Freq: Every day | NASAL | 0 refills | Status: DC

## 2019-11-22 MED FILL — OMEPRAZOLE DR 20 MG CAPSULE: 20 | 30 days supply | Qty: 30 | Fill #0

## 2019-11-22 NOTE — Assessment & Plan Note (Addendum)
Michelle Clay is a 49 yo F w/ PMH of HTN, pre-diabetes presenting to [facility] with complaint of cough. She mentions endorsing 2 weeks of productive cough without inciting events. She mentions having some sinus congestion and bringing up clear mucus. She denies any sick contact and mentions that she has had the COVID vaccines. She denies any fevers, chills, nausea, vomiting. Mentions taking Claritin for allergies.  A/P Hx and exam consistent w/ post-nasal drip. Advised on sinus rinse and recommend Flonase. Also advised to c/w omeprazole as GERD may play a component - C/w Flonase, omeprazole

## 2019-11-22 NOTE — Progress Notes (Signed)
   CC: cough  HPI: [name] is a 49 y.o. with PMH listed below presenting with complaint of cough. Please see problem based assessment and plan for further details.  Past Medical History:  Diagnosis Date  . Acid reflux   . Hypertension     Review of Systems: Review of Systems  Constitutional: Negative for chills, fever and malaise/fatigue.  Respiratory: Positive for cough and sputum production. Negative for shortness of breath and wheezing.   Cardiovascular: Negative for chest pain and palpitations.  Gastrointestinal: Negative for constipation, diarrhea, nausea and vomiting.  All other systems reviewed and are negative.    Physical Exam: Vitals:   11/22/19 0923 11/22/19 0943  BP: 132/72 124/80  Pulse: 73   Temp: 98.3 F (36.8 C)   TempSrc: Oral   SpO2: 99%   Weight: 174 lb 3.2 oz (79 kg)   Height: 5\' 3"  (1.6 m)    Gen: Well-developed, well nourished, NAD HEENT: NCAT head, hearing intact, no sinus tenderness CV: RRR, S1, S2 normal Pulm: CTAB, No rales, no wheezes Extm: ROM intact, Peripheral pulses intact, No peripheral edema Skin: Dry, Warm, normal turgor, no wounds  Assessment & Plan:   Cough with congestion of paranasal sinus [name] is a 49 yo F w/ PMH of HTN, pre-diabetes presenting to [facility] with complaint of cough. She mentions endorsing 2 weeks of productive cough without inciting events. She mentions having some sinus congestion and bringing up clear mucus. She denies any sick contact and mentions that she has had the COVID vaccines. She denies any fevers, chills, nausea, vomiting. Mentions taking Claritin for allergies.  A/P Hx and exam consistent w/ post-nasal drip. Advised on sinus rinse and recommend Flonase. Also advised to c/w omeprazole as GERD may play a component - C/w Flonase, omeprazole  Essential hypertension BP Readings from Last 3 Encounters:  11/22/19 124/80  08/24/19 124/73  08/04/19 (!) 138/98   Blood pressure at goal. Currently  on lisinopril 20mg  daily. No indication to up-titrate regimen. Continue current meds.  - C/w lisinopril 20mg  daily    Patient discussed with Dr. 08/06/19  - , PGY3 [facility] Internal Medicine Pager: [phone]

## 2019-11-22 NOTE — Patient Instructions (Signed)
Thank you for allowing [place] to provide your care today. Today we discussed your blood pressure    I have ordered no labs for you. I will call if any are abnormal.    Today we made no changes to your medications.    Please follow-up in 6 months.    Should you have any questions or concerns please call the internal medicine clinic at [phone].     Goteo posnasal Postnasal Drip El goteo posnasal es la sensacin de tener mucosidad que baja por la parte posterior de la garganta. La mucosidad es una sustancia viscosa que humedece y limpia la nariz y la garganta, as como las cavidades de aire que se encuentran en los huesos de la cara cerca de la frente y las mejillas (senos). Constantemente hay pequeas cantidades de mucosidad que pasan de la Darene Lamer y los senos hacia abajo por la parte posterior de la garganta. Esto es normal. Cuando hay una cantidad excesiva de mucosidad o cuando la mucosidad se torna muy espesa, usted puede sentirla. Las siguientes son algunas causas frecuentes del goteo posnasal:  Ms mucosidad debido a lo siguiente: ? Un resfro o gripe. ? Alergias. ? Aire fro. ? Ciertos medicamentos.  Ms mucosidad que es ms espesa debido a lo siguiente: ? Sinusitis o infeccin nasal. ? Aire seco. ? Alergia a algn alimento. Siga estas indicaciones en su casa: Cmo Engineer, building services grgaras con una mezcla de agua y sal 3 o 4veces al da, o cuando sea necesario. Para preparar la mezcla de agua y sal, disuelva totalmente de media a 1cucharadita de sal en 1taza de agua tibia.  Si el aire en su casa es seco, use un humidificador para agregarle humedad.  Use un aerosol salino o una lota nasal (neti pot) para lavar la nariz (irrigacin nasal). Estos mtodos pueden ayudar a Pharmacologist mucosidad y [employer] fosas nasales hmedas. Instrucciones generales  [employer] de venta libre y los recetados solamente como se lo haya indicado el mdico.  Siga las  indicaciones del mdico respecto de las restricciones de comidas o bebidas. Es posible que deba evitar la cafena.  Evite las cosas que sabe que le producen Programmer, multimedia (alrgenos), como el polvo, el moho, el polen, las mascotas o ciertos alimentos.  Beba suficiente lquido para Photographer orina de color amarillo plido.  Concurra a todas las visitas de control como se lo haya indicado el mdico. Esto es importante. Comunquese con un mdico si:  Tiene fiebre.  Le duele la garganta.  Tiene dificultad para tragar.  Tiene dolor de [place].  Tiene dolor en los senos paranasales.  Tiene tos que no desaparece.  La mucosidad de la [place] se torna espesa y es de color verde o [place].  Tiene sntomas de resfro o gripe que duran ms de 10das. Resumen  El goteo posnasal es la sensacin de tener mucosidad que baja por la parte posterior de la garganta.  Si el mdico lo autoriza, use la irrigacin nasal o un aerosol nasal entre 2y 4veces por Futures trader.  Evite las cosas que sabe que le producen Programmer, multimedia (alrgenos), como el polvo, el moho, el polen, las mascotas o ciertos alimentos. Esta informacin no tiene Theme park manager el consejo del mdico. Asegrese de hacerle al mdico cualquier pregunta que tenga. Document Revised: 01/13/2017 Document Reviewed: 01/13/2017 Elsevier Patient Education  2020 [employer].

## 2019-11-22 NOTE — Assessment & Plan Note (Signed)
BP Readings from Last 3 Encounters:  11/22/19 124/80  08/24/19 124/73  08/04/19 (!) 138/98   Blood pressure at goal. Currently on lisinopril 20mg  daily. No indication to up-titrate regimen. Continue current meds.  - C/w lisinopril 20mg  daily

## 2019-11-22 NOTE — Addendum Note (Signed)
Addended by: Theotis Barrio on: 11/22/2019 01:27 PM   Modules accepted: Orders

## 2019-11-24 NOTE — Progress Notes (Signed)
Internal Medicine Clinic Attending  Case discussed with Dr. Lee  At the time of the visit.  We reviewed the resident's history and exam and pertinent patient test results.  I agree with the assessment, diagnosis, and plan of care documented in the resident's note.    

## 2019-11-25 ENCOUNTER — Encounter: Payer: No Typology Code available for payment source | Admitting: Internal Medicine

## 2019-11-30 MED FILL — FLUTICASONE PROP 50 MCG SPR: 50 | 60 days supply | Qty: 16 | Fill #0

## 2019-12-08 ENCOUNTER — Ambulatory Visit: Payer: No Typology Code available for payment source

## 2019-12-17 MED FILL — LISINOPRIL 20 MG TABLET: 20 | 30 days supply | Qty: 30 | Fill #0

## 2020-01-13 MED FILL — LISINOPRIL 20 MG TABLET: 20 | 30 days supply | Qty: 30 | Fill #1

## 2020-02-15 MED FILL — LISINOPRIL 20 MG TABLET: 20 | 30 days supply | Qty: 30 | Fill #2

## 2020-03-14 MED FILL — OMEPRAZOLE DR 20 MG CAPSULE: 20 | 30 days supply | Qty: 30 | Fill #1

## 2020-03-14 MED FILL — LISINOPRIL 20 MG TABLET: 20 | 30 days supply | Qty: 30 | Fill #3

## 2020-03-18 ENCOUNTER — Emergency Department (HOSPITAL_COMMUNITY)
Admission: EM | Admit: 2020-03-18 | Discharge: 2020-03-18 | Disposition: A | Discharge status: Home / Self Care | Payer: No Typology Code available for payment source | Attending: Emergency Medicine | Admitting: Emergency Medicine

## 2020-03-18 ENCOUNTER — Encounter (HOSPITAL_COMMUNITY): Payer: Self-pay | Admitting: Emergency Medicine

## 2020-03-18 DIAGNOSIS — K219 Gastro-esophageal reflux disease without esophagitis: Secondary | ICD-10-CM

## 2020-03-18 DIAGNOSIS — R1013 Epigastric pain: Secondary | ICD-10-CM | POA: Insufficient documentation

## 2020-03-18 DIAGNOSIS — I10 Essential (primary) hypertension: Secondary | ICD-10-CM | POA: Insufficient documentation

## 2020-03-18 DIAGNOSIS — Z79899 Other long term (current) drug therapy: Secondary | ICD-10-CM | POA: Insufficient documentation

## 2020-03-18 DIAGNOSIS — R42 Dizziness and giddiness: Secondary | ICD-10-CM | POA: Insufficient documentation

## 2020-03-18 LAB — URINALYSIS, ROUTINE W REFLEX MICROSCOPIC
Bilirubin Urine: NEGATIVE
Glucose, UA: NEGATIVE mg/dL
Hgb urine dipstick: NEGATIVE
Ketones, ur: NEGATIVE mg/dL
Leukocytes,Ua: NEGATIVE
Nitrite: NEGATIVE
Protein, ur: NEGATIVE mg/dL
Specific Gravity, Urine: 1.012 (ref 1.005–1.030)
pH: 6 (ref 5.0–8.0)

## 2020-03-18 LAB — CBC
HCT: 38.7 % (ref 36.0–46.0)
Hemoglobin: 13.1 g/dL (ref 12.0–15.0)
MCH: 32 pg (ref 26.0–34.0)
MCHC: 33.9 g/dL (ref 30.0–36.0)
MCV: 94.6 fL (ref 80.0–100.0)
Platelets: 273 10*3/uL (ref 150–400)
RBC: 4.09 MIL/uL (ref 3.87–5.11)
RDW: 11.7 % (ref 11.5–15.5)
WBC: 9.5 10*3/uL (ref 4.0–10.5)
nRBC: 0 % (ref 0.0–0.2)

## 2020-03-18 LAB — COMPREHENSIVE METABOLIC PANEL
ALT: 25 U/L (ref 0–44)
AST: 21 U/L (ref 15–41)
Albumin: 4.2 g/dL (ref 3.5–5.0)
Alkaline Phosphatase: 59 U/L (ref 38–126)
Anion gap: 10 (ref 5–15)
BUN: 10 mg/dL (ref 6–20)
CO2: 26 mmol/L (ref 22–32)
Calcium: 9.7 mg/dL (ref 8.9–10.3)
Chloride: 102 mmol/L (ref 98–111)
Creatinine, Ser: 0.68 mg/dL (ref 0.44–1.00)
GFR, Estimated: >60 mL/min (ref >60)
Glucose, Bld: 125 mg/dL — ABNORMAL HIGH (ref 70–99)
Potassium: 3.7 mmol/L (ref 3.5–5.1)
Sodium: 138 mmol/L (ref 135–145)
Total Bilirubin: 0.6 mg/dL (ref 0.3–1.2)
Total Protein: 7.2 g/dL (ref 6.5–8.1)

## 2020-03-18 LAB — I-STAT BETA HCG BLOOD, ED (MC, WL, AP ONLY): I-stat hCG, quantitative: <5 m[IU]/mL (ref <5)

## 2020-03-18 LAB — LIPASE, BLOOD: Lipase: 38 U/L (ref 11–51)

## 2020-03-18 MED ORDER — ALUM & MAG HYDROXIDE-SIMETH 200-200-20 MG/5ML PO SUSP
30.0000 mL | Freq: Once | ORAL | Status: AC
  Administered 2020-03-18: 30 mL via ORAL
  Filled 2020-03-18: qty 30

## 2020-03-18 MED ORDER — NAPROXEN 250 MG PO TABS
375.0000 mg | ORAL_TABLET | Freq: Once | ORAL | Status: AC
  Administered 2020-03-18: 375 mg via ORAL
  Filled 2020-03-18: qty 2

## 2020-03-18 MED ORDER — SUCRALFATE 1 GM/10ML PO SUSP: 1.0000 g | Freq: Three times a day (TID) | ORAL | 0 refills | Status: DC

## 2020-03-18 MED ORDER — ACETAMINOPHEN 500 MG PO TABS
1000.0000 mg | ORAL_TABLET | Freq: Once | ORAL | Status: AC
  Administered 2020-03-18: 1000 mg via ORAL
  Filled 2020-03-18: qty 2

## 2020-03-18 NOTE — ED Triage Notes (Signed)
Patient arrived with EMS reports vertigo/dizziness this evening , patient added mid abdominal pain with diarrhea and headache this week .

## 2020-03-18 NOTE — ED Provider Notes (Signed)
[facility]   CSN: [id number] Arrival date & time: 03/18/20  0144     History Chief Complaint  Patient presents with  . Abdominal Pain  . Dizziness    Michelle PresumeSara Guerrette is a 49 y.o. female.  The history is provided by the patient. The history is limited by a language barrier. A language interpreter was used.  Abdominal Pain Pain location:  Epigastric Pain quality: burning   Pain radiates to:  Does not radiate Pain severity:  Mild Onset quality:  Gradual Timing:  Intermittent Progression:  Unchanged Chronicity:  Chronic Context: eating   Relieved by:  Nothing Worsened by:  Nothing Ineffective treatments: omeprazole. Associated symptoms: no anorexia, no belching, no chest pain, no chills, no constipation, no cough, no diarrhea, no dysuria, no fatigue, no fever, no flatus, no hematemesis, no hematochezia, no hematuria, no melena, no nausea, no shortness of breath, no sore throat, no vaginal bleeding, no vaginal discharge and no vomiting   Risk factors: no alcohol abuse   Dizziness Associated symptoms: no chest pain, no diarrhea, no nausea, no shortness of breath, no vomiting and no weakness   Patient with GERD with symptoms of gerd for 3 weeks and mild frontal HA.  No f/c/r.  No n/v/d.  No changes in vision or speech.       Past Medical History:  Diagnosis Date  . Acid reflux   . Hypertension     Patient Active Problem List   Diagnosis Date Noted  . Cough with congestion of paranasal sinus 11/22/2019  . Essential hypertension 08/24/2019  . Prediabetes 08/24/2019  . Pure hypercholesterolemia 08/24/2019    History reviewed. No pertinent surgical history.   OB History    Gravida  2   Para      Term      Preterm      AB      Living        SAB      TAB      Ectopic      Multiple      Live Births  2           Family History  Problem Relation Age of Onset  . Hypertension Mother   .  Hypertension Sister     Social History   Tobacco Use  . Smoking status: Never Smoker  . Smokeless tobacco: Never Used  Vaping Use  . Vaping Use: Never used  Substance Use Topics  . Alcohol use: No  . Drug use: Never    Home Medications Prior to Admission medications   Medication Sig Start Date End Date Taking? Authorizing Provider  fluticasone (FLONASE) 50 MCG/ACT nasal spray Place 1 spray into both nostrils daily. 11/22/19 11/21/20  Theotis BarrioLee, Joshua K, MD  lisinopril (ZESTRIL) 20 MG tablet Take 1 tablet (20 mg total) by mouth daily. 11/22/19   Theotis BarrioLee, Joshua K, MD  omeprazole (PRILOSEC) 20 MG capsule Take 1 capsule (20 mg total) by mouth daily. 11/22/19   Theotis BarrioLee, Joshua K, MD    Allergies    Patient has no known allergies.  Review of Systems   Review of Systems  Constitutional: Negative for chills, fatigue and fever.  HENT: Negative for sore throat.   Eyes: Negative for visual disturbance.  Respiratory: Negative for cough and shortness of breath.   Cardiovascular: Negative for chest pain.  Gastrointestinal: Positive for abdominal pain. Negative for anorexia, constipation, diarrhea, flatus, hematemesis, hematochezia, melena, nausea and vomiting.  Genitourinary:  Negative for dysuria, hematuria, vaginal bleeding and vaginal discharge.  Musculoskeletal: Negative for arthralgias.  Skin: Negative for rash.  Neurological: Positive for light-headedness. Negative for tremors, syncope, facial asymmetry, speech difficulty and weakness.  Psychiatric/Behavioral: Negative for agitation.  All other systems reviewed and are negative.   Physical Exam Updated Vital Signs BP (!) 141/75   Pulse 76   Temp 98.1 F (36.7 C) (Oral)   Resp 11   Ht 5\' 6"  (1.676 m)   Wt 85 kg   LMP 03/01/2020   SpO2 100%   BMI 30.25 kg/m   Physical Exam Vitals and nursing note reviewed.  Constitutional:      General: She is not in acute distress.    Appearance: Normal appearance.  HENT:     Head: Normocephalic  and atraumatic.     Nose: Nose normal.  Eyes:     Conjunctiva/sclera: Conjunctivae normal.     Pupils: Pupils are equal, round, and reactive to light.  Cardiovascular:     Rate and Rhythm: Normal rate and regular rhythm.     Pulses: Normal pulses.     Heart sounds: Normal heart sounds.  Pulmonary:     Effort: Pulmonary effort is normal.     Breath sounds: Normal breath sounds.  Abdominal:     General: Abdomen is flat. Bowel sounds are normal.     Palpations: Abdomen is soft.     Tenderness: There is no abdominal tenderness. There is no guarding.  Musculoskeletal:        General: Normal range of motion.     Cervical back: Normal range of motion and neck supple.  Skin:    General: Skin is warm and dry.     Capillary Refill: Capillary refill takes less than 2 seconds.  Neurological:     General: No focal deficit present.     Mental Status: She is alert and oriented to person, place, and time.     Cranial Nerves: No cranial nerve deficit.     Deep Tendon Reflexes: Reflexes normal.  Psychiatric:        Mood and Affect: Mood normal.        Behavior: Behavior normal.     ED Results / Procedures / Treatments   Labs (all labs ordered are listed, but only abnormal results are displayed) Results for orders placed or performed during the hospital encounter of 03/18/20  Lipase, blood  Result Value Ref Range   Lipase 38 11 - 51 U/L  Comprehensive metabolic panel  Result Value Ref Range   Sodium 138 135 - 145 mmol/L   Potassium 3.7 3.5 - 5.1 mmol/L   Chloride 102 98 - 111 mmol/L   CO2 26 22 - 32 mmol/L   Glucose, Bld 125 (H) 70 - 99 mg/dL   BUN 10 6 - 20 mg/dL   Creatinine, Ser 03/20/20 0.44 - 1.00 mg/dL   Calcium 9.7 8.9 - 6.57 mg/dL   Total Protein 7.2 6.5 - 8.1 g/dL   Albumin 4.2 3.5 - 5.0 g/dL   AST 21 15 - 41 U/L   ALT 25 0 - 44 U/L   Alkaline Phosphatase 59 38 - 126 U/L   Total Bilirubin 0.6 0.3 - 1.2 mg/dL   GFR, Estimated 84.6 >96 mL/min   Anion gap 10 5 - 15  CBC    Result Value Ref Range   WBC 9.5 4.0 - 10.5 K/uL   RBC 4.09 3.87 - 5.11 MIL/uL   Hemoglobin 13.1 12.0 - 15.0  g/dL   HCT 66.5 36 - 46 %   MCV 94.6 80.0 - 100.0 fL   MCH 32.0 26.0 - 34.0 pg   MCHC 33.9 30.0 - 36.0 g/dL   RDW 99.3 57.0 - 17.7 %   Platelets 273 150 - 400 K/uL   nRBC 0.0 0.0 - 0.2 %  Urinalysis, Routine w reflex microscopic Urine, Clean Catch  Result Value Ref Range   Color, Urine YELLOW YELLOW   APPearance CLEAR CLEAR   Specific Gravity, Urine 1.012 1.005 - 1.030   pH 6.0 5.0 - 8.0   Glucose, UA NEGATIVE NEGATIVE mg/dL   Hgb urine dipstick NEGATIVE NEGATIVE   Bilirubin Urine NEGATIVE NEGATIVE   Ketones, ur NEGATIVE NEGATIVE mg/dL   Protein, ur NEGATIVE NEGATIVE mg/dL   Nitrite NEGATIVE NEGATIVE   Leukocytes,Ua NEGATIVE NEGATIVE  I-Stat beta hCG blood, ED  Result Value Ref Range   I-stat hCG, quantitative <5.0 <5 mIU/mL   Comment 3           No results found.  Radiology No results found.  Procedures Procedures (including critical care time)  Medications Ordered in ED Medications  alum & mag hydroxide-simeth (MAALOX/MYLANTA) 200-200-20 MG/5ML suspension 30 mL (has no administration in time range)  naproxen (NAPROSYN) tablet 375 mg (has no administration in time range)  acetaminophen (TYLENOL) tablet 1,000 mg (has no administration in time range)    ED Course  I have reviewed the triage vital signs and the nursing notes.  Pertinent labs & imaging results that were available during my care of the patient were reviewed by me and considered in my medical decision making (see chart for details).   Symptoms are consistent with GERD.  Will add carafate to patient's home regimen and have advised a GERD friendly diet.  No proptosis, EOMI, disks are sharp, highly doubt cavernous sinus thrombosis.  I doubt ICH based on the time course and symptoms, I also doubt meningitis as no fever, neck pain or rash.  Symptoms likely tension headache. I do not believe without  focal neurologic symptoms that imaging is indicated.   Tylenol and ibuprofen    Michelle Clay was evaluated in Emergency Department on 03/18/2020 for the symptoms described in the history of present illness. She was evaluated in the context of the global COVID-19 pandemic, which necessitated consideration that the patient might be at risk for infection with the SARS-CoV-2 virus that causes COVID-19. Institutional protocols and algorithms that pertain to the evaluation of patients at risk for COVID-19 are in a state of rapid change based on information released by regulatory bodies including the CDC and federal and state organizations. These policies and algorithms were followed during the patient's care in the ED.  Final Clinical Impression(s) / ED Diagnoses Return for intractable cough, coughing up blood,fevers >100.4 unrelieved by medication, shortness of breath, intractable vomiting, chest pain, shortness of breath, weakness,numbness, changes in speech, facial asymmetry,abdominal pain, passing out,Inability to tolerate liquids or food, cough, altered mental status or any concerns. No signs of systemic illness or infection. The patient is nontoxic-appearing on exam and vital signs are within normal limits.   I have reviewed the triage vital signs and the nursing notes. Pertinent labs &imaging results that were available during my care of the patient were reviewed by me and considered in my medical decision making (see chart for details).After history, exam, and medical workup I feel the patient has beenappropriately medically screened and is safe for discharge home. Pertinent diagnoses were discussed with the  patient. Patient was given return precautions.   Melane Windholz, MD 03/18/20 [phone]

## 2020-04-10 ENCOUNTER — Other Ambulatory Visit (HOSPITAL_COMMUNITY): Payer: Self-pay | Admitting: Medical

## 2020-04-10 MED FILL — LISINOPRIL 30 MG TABS: 30 | 90 days supply | Qty: 90 | Fill #0

## 2020-05-30 ENCOUNTER — Telehealth: Payer: Self-pay

## 2020-05-30 NOTE — Telephone Encounter (Signed)
Attempted to call patient via interpreter Natale Lay about conducting [facility]. Voicemail box was full unable to leave message.

## 2020-06-02 LAB — GLUCOSE, POCT (MANUAL RESULT ENTRY): POC Glucose: 121 mg/dl — AB (ref 70–99)

## 2020-08-16 ENCOUNTER — Other Ambulatory Visit (HOSPITAL_COMMUNITY): Payer: Self-pay

## 2020-08-16 MED FILL — Lisinopril Tab 30 MG: ORAL | 90 days supply | Qty: 90 | Fill #0 | Status: AC

## 2020-08-16 MED FILL — Omeprazole Cap Delayed Release 20 MG: ORAL | 30 days supply | Qty: 30 | Fill #0 | Status: AC

## 2020-11-03 ENCOUNTER — Other Ambulatory Visit (HOSPITAL_COMMUNITY): Payer: Self-pay

## 2020-11-03 ENCOUNTER — Ambulatory Visit (HOSPITAL_COMMUNITY)
Admission: EM | Admit: 2020-11-03 | Discharge: 2020-11-03 | Disposition: A | Discharge status: Home / Self Care | Payer: 59 | Attending: Family Medicine | Admitting: Family Medicine

## 2020-11-03 ENCOUNTER — Encounter (HOSPITAL_COMMUNITY): Payer: Self-pay | Admitting: Emergency Medicine

## 2020-11-03 ENCOUNTER — Other Ambulatory Visit: Payer: Self-pay

## 2020-11-03 DIAGNOSIS — H6121 Impacted cerumen, right ear: Secondary | ICD-10-CM

## 2020-11-03 MED ORDER — CARBAMIDE PEROXIDE 6.5 % OT SOLN
5.0000 [drp] | Freq: Two times a day (BID) | OTIC | 0 refills | Status: DC | PRN
  Filled 2020-11-03: qty 15, 30d supply, fill #0

## 2020-11-03 NOTE — ED Triage Notes (Signed)
Patient presents to [facility] for evaluation of right ear pain starting Wednesday, c/o heat to the touch starting today.

## 2020-11-03 NOTE — ED Provider Notes (Signed)
[facility]    CSN: [id number] Arrival date & time: 11/03/20  1237      History   Chief Complaint Chief Complaint  Patient presents with   Ear Pain    HPI Michelle Clay is a 50 y.o. female.   Medical interpreter utilized today to facilitate visit with patient's consent.  Patient presenting today with several day history of right ear pain after hiking in the woods and thinking a bug might have gotten into her ear.  She now feels a warm sensation surrounding the ear additionally.  She denies drainage, muffled hearing, headache, sick symptoms, history of ear issues.  Has not tried anything over-the-counter for symptoms.  Past Medical History:  Diagnosis Date   Acid reflux    Hypertension    Patient Active Problem List   Diagnosis Date Noted   Cough with congestion of paranasal sinus 11/22/2019   Essential hypertension 08/24/2019   Prediabetes 08/24/2019   Pure hypercholesterolemia 08/24/2019    History reviewed. No pertinent surgical history.  OB History     Gravida  2   Para      Term      Preterm      AB      Living         SAB      IAB      Ectopic      Multiple      Live Births  2          Home Medications    Prior to Admission medications   Medication Sig Start Date End Date Taking? Authorizing Provider  carbamide peroxide (DEBROX) 6.5 % OTIC solution Place 5 drops into the right ear 2 (two) times daily as needed. 11/03/20  Yes Particia Nearing, PA-C  fluticasone [facility]) 50 MCG/ACT nasal spray Place 1 spray into both nostrils daily. 11/22/19 11/21/20  Theotis Barrio, MD  lisinopril (ZESTRIL) 20 MG tablet TAKE 1 TABLET (20 MG TOTAL) BY MOUTH DAILY. 11/22/19 11/21/20  Theotis Barrio, MD  lisinopril (ZESTRIL) 30 MG tablet TAKE 1 TABLET BY MOUTH EVERY DAY 04/10/20 04/10/21  Barry Brunner A, PA-C  omeprazole (PRILOSEC) 20 MG capsule TAKE 1 CAPSULE (20 MG TOTAL) BY MOUTH DAILY. 11/22/19 11/21/20  Theotis Barrio, MD  sucralfate (CARAFATE) 1  GM/10ML suspension Take 10 mLs (1 g total) by mouth 4 (four) times daily -  with meals and at bedtime. 03/18/20   Palumbo, April, MD   Family History Family History  Problem Relation Age of Onset   Hypertension Mother    Hypertension Sister     Social History Social History   Tobacco Use   Smoking status: Never   Smokeless tobacco: Never  Vaping Use   Vaping Use: Never used  Substance Use Topics   Alcohol use: No   Drug use: Never     Allergies   Patient has no known allergies.   Review of Systems Review of Systems Per HPI  Physical Exam Triage Vital Signs ED Triage Vitals  Enc Vitals Group     BP 11/03/20 1344 (!) 143/88     Pulse Rate 11/03/20 1344 71     Resp 11/03/20 1344 16     Temp 11/03/20 1344 98.3 F (36.8 C)     Temp src --      SpO2 11/03/20 1344 96 %     Weight --      Height --      Head Circumference --  Peak Flow --      Pain Score 11/03/20 1345 5     Pain Loc --      Pain Edu? --      Excl. in GC? --    No data found.  Updated Vital Signs BP (!) 143/88 (BP Location: Left Arm)   Pulse 71   Temp 98.3 F (36.8 C)   Resp 16   LMP 09/30/2020   SpO2 96%   Visual Acuity Right Eye Distance:   Left Eye Distance:   Bilateral Distance:    Right Eye Near:   Left Eye Near:    Bilateral Near:     Physical Exam Vitals and nursing note reviewed.  Constitutional:      Appearance: Normal appearance. She is not ill-appearing.  HENT:     Head: Atraumatic.     Right Ear: Tympanic membrane normal.     Left Ear: Tympanic membrane normal.     Ears:     Comments: Mild to moderate cerumen debris right EAC    Nose: Nose normal.     Mouth/Throat:     Mouth: Mucous membranes are moist.     Pharynx: Oropharynx is clear.  Eyes:     Extraocular Movements: Extraocular movements intact.     Conjunctiva/sclera: Conjunctivae normal.  Cardiovascular:     Rate and Rhythm: Normal rate and regular rhythm.     Heart sounds: Normal heart sounds.   Pulmonary:     Effort: Pulmonary effort is normal.     Breath sounds: Normal breath sounds.  Musculoskeletal:        General: Normal range of motion.     Cervical back: Normal range of motion and neck supple.  Skin:    General: Skin is warm and dry.  Neurological:     Mental Status: She is alert and oriented to person, place, and time.  Psychiatric:        Mood and Affect: Mood normal.        Thought Content: Thought content normal.        Judgment: Judgment normal.   UC Treatments / Results  Labs (all labs ordered are listed, but only abnormal results are displayed) Labs Reviewed - No data to display  EKG   Radiology No results found.  Procedures Procedures (including critical care time)  Medications Ordered in UC Medications - No data to display  Initial Impression / Assessment and Plan / UC Course  I have reviewed the triage vital signs and the nursing notes.  Pertinent labs & imaging results that were available during my care of the patient were reviewed by me and considered in my medical decision making (see chart for details).     Right ear benign-appearing on exam today other than some slight cerumen debris which may be causing a foreign body sensation.  Debrox drops given with instructions.  Follow-up if symptoms worsening or not resolving.  Final Clinical Impressions(s) / UC Diagnoses   Final diagnoses:  Cerumen debris on tympanic membrane of right ear   Discharge Instructions   None    ED Prescriptions     Medication Sig Dispense Auth. Provider   carbamide peroxide (DEBROX) 6.5 % OTIC solution Place 5 drops into the right ear 2 (two) times daily as needed. 15 mL Particia Nearing, [place]      PDMP not reviewed this encounter.   Particia Nearing, [place] 11/03/20 [phone]

## 2020-12-05 ENCOUNTER — Other Ambulatory Visit: Payer: Self-pay

## 2020-12-05 ENCOUNTER — Encounter: Payer: Self-pay | Admitting: Family Medicine

## 2020-12-05 ENCOUNTER — Ambulatory Visit (INDEPENDENT_AMBULATORY_CARE_PROVIDER_SITE_OTHER): Payer: 59 | Admitting: Family Medicine

## 2020-12-05 VITALS — BP 112/70 | HR 78 | Ht 63.0 in | Wt 168.0 lb

## 2020-12-05 DIAGNOSIS — Z114 Encounter for screening for human immunodeficiency virus [HIV]: Secondary | ICD-10-CM | POA: Diagnosis not present

## 2020-12-05 DIAGNOSIS — Z0000 Encounter for general adult medical examination without abnormal findings: Secondary | ICD-10-CM | POA: Diagnosis not present

## 2020-12-05 DIAGNOSIS — R7303 Prediabetes: Secondary | ICD-10-CM

## 2020-12-05 DIAGNOSIS — Z1159 Encounter for screening for other viral diseases: Secondary | ICD-10-CM

## 2020-12-05 DIAGNOSIS — I10 Essential (primary) hypertension: Secondary | ICD-10-CM

## 2020-12-05 LAB — POCT GLYCOSYLATED HEMOGLOBIN (HGB A1C): HbA1c, POC (prediabetic range): 6 % (ref 5.7–6.4)

## 2020-12-05 NOTE — Assessment & Plan Note (Signed)
-  BP 112/70, at goal today -continue on lisinopril 30 mg -diet and exercise counseling

## 2020-12-05 NOTE — Assessment & Plan Note (Signed)
-  A1c 6.0 today, still within prediabetes range -diet and exercise counseling provided -nutrition handout given

## 2020-12-05 NOTE — Patient Instructions (Signed)
It was great seeing you today!  Today we got labs, I will let you know of any abnormal results.   Please follow up at your next scheduled appointment in 1-2 weeks to discuss birth control, if anything arises between now and then, please don't hesitate to contact our office.   Thank you for allowing [place] to be a part of your medical care!  Thank you, Dr. Robyne Peers

## 2020-12-05 NOTE — Progress Notes (Signed)
    SUBJECTIVE:   CHIEF COMPLAINT / HPI:   Patient presents to establish care, previously seen by another practice and now requesting to change providers based on insurance coverage. Past medical history significant for hypertension, GERD, prediabetes and elevated cholesterol. Denies any previous surgeries or recent hospitalizations. Denies any known drug allergies and significant family history. Eats very little fruits and vegetables, diet primarily consist of chicken, rice and beans. Sometimes exercises by taking walks in the park. Denies illicit drug, tobacco and alcohol use. Sexually active with one female partner, uses protection with each encounter. Currently not on birth control per preference. Works at [employer].   OBJECTIVE:   BP 112/70   Pulse 78   Ht 5\' 3"  (1.6 m)   Wt 168 lb (76.2 kg)   LMP 11/22/2020   SpO2 97%   BMI 29.76 kg/m   General: Patient well-appearing, in no acute distress. HEENT: normocephalic, atraumatic, non-tender thyroid, no cervical LAD CV: RRR, no murmurs or gallops auscultated Resp: CTAB, no wheezing, rales or rhonchi Abdomen: soft, nontender, presence of bowel sounds Ext: radial and distal pulses strong and equal bilaterally, no LE edema noted bilaterally  Psych: mood appropriate   ASSESSMENT/PLAN:   Prediabetes -A1c 6.0 today, still within prediabetes range -diet and exercise counseling provided -nutrition handout given   Essential hypertension -BP 112/70, at goal today -continue on lisinopril 30 mg -diet and exercise counseling   Health maintenance -pending CBC, CMP, TSH, lipid panel, HIV testing and Hep C screening  -med rec reviewed and updated accordingly  -Had mammogram in April 2022. -Due for PAP smear, plan for this at the next or following visit. -Plan for contraception counseling at next visit. -Never had colonoscopy, discuss referral at next visit.   Video Spanish interpretation utilized throughout the entirety of this encounter.    May 2022, DO [facility]

## 2020-12-06 LAB — COMPREHENSIVE METABOLIC PANEL
ALT: 22 IU/L (ref 0–32)
AST: 18 IU/L (ref 0–40)
Albumin/Globulin Ratio: 2.3 — ABNORMAL HIGH (ref 1.2–2.2)
Albumin: 4.9 g/dL (ref 3.8–4.9)
Alkaline Phosphatase: 73 IU/L (ref 44–121)
BUN/Creatinine Ratio: 24 — ABNORMAL HIGH (ref 9–23)
BUN: 13 mg/dL (ref 6–24)
Bilirubin Total: 0.3 mg/dL (ref 0.0–1.2)
CO2: 24 mmol/L (ref 20–29)
Calcium: 10.1 mg/dL (ref 8.7–10.2)
Chloride: 100 mmol/L (ref 96–106)
Creatinine, Ser: 0.54 mg/dL — ABNORMAL LOW (ref 0.57–1.00)
Globulin, Total: 2.1 g/dL (ref 1.5–4.5)
Glucose: 110 mg/dL — ABNORMAL HIGH (ref 65–99)
Potassium: 4.4 mmol/L (ref 3.5–5.2)
Sodium: 140 mmol/L (ref 134–144)
Total Protein: 7 g/dL (ref 6.0–8.5)
eGFR: 111 mL/min/{1.73_m2} (ref >59)

## 2020-12-06 LAB — CBC
Hematocrit: 40.9 % (ref 34.0–46.6)
Hemoglobin: 13.9 g/dL (ref 11.1–15.9)
MCH: 31.9 pg (ref 26.6–33.0)
MCHC: 34 g/dL (ref 31.5–35.7)
MCV: 94 fL (ref 79–97)
Platelets: 259 10*3/uL (ref 150–450)
RBC: 4.36 x10E6/uL (ref 3.77–5.28)
RDW: 12 % (ref 11.7–15.4)
WBC: 6.4 10*3/uL (ref 3.4–10.8)

## 2020-12-06 LAB — LIPID PANEL
Chol/HDL Ratio: 4.1 ratio (ref 0.0–4.4)
Cholesterol, Total: 209 mg/dL — ABNORMAL HIGH (ref 100–199)
HDL: 51 mg/dL (ref >39)
LDL Chol Calc (NIH): 138 mg/dL — ABNORMAL HIGH (ref 0–99)
Triglycerides: 111 mg/dL (ref 0–149)
VLDL Cholesterol Cal: 20 mg/dL (ref 5–40)

## 2020-12-06 LAB — TSH: TSH: 1.28 u[IU]/mL (ref 0.450–4.500)

## 2020-12-06 LAB — HIV ANTIBODY (ROUTINE TESTING W REFLEX): HIV Screen 4th Generation wRfx: NONREACTIVE

## 2020-12-06 LAB — HCV INTERPRETATION

## 2020-12-06 LAB — HCV AB W REFLEX TO QUANT PCR: HCV Ab: <0.1 s/co ratio (ref 0.0–0.9)

## 2020-12-21 ENCOUNTER — Ambulatory Visit: Payer: No Typology Code available for payment source | Admitting: Family Medicine

## 2021-01-10 ENCOUNTER — Ambulatory Visit: Payer: No Typology Code available for payment source | Admitting: Family Medicine

## 2021-01-15 NOTE — Progress Notes (Signed)
    SUBJECTIVE:   CHIEF COMPLAINT / HPI:   Ms. Michelle Clay is a 50 yo F who presents for the below.   Pap Smear Last Pap smear 06/30/2017 with NILM.  Prior paps: Negative Patient's last menstrual period was 11/22/2020 (exact date). Periods are irregular. Contraception: Condoms, patient desires OCPs today.  Denies history of tobacco use as well as history of VTE. Sexually Active: Yes Desire for STD Screening: Yes, just pelvic swabs declines blood work Last mammogram: 07/23/2020, normal  HM Colonoscopy-declines referral at this time plans to call back when she is ready  PERTINENT  PMH / PSH: HTN (would like refill on blood pressure medication today)  OBJECTIVE:   BP 110/70   Pulse 78   Ht 5\' 3"  (1.6 m)   Wt 165 lb (74.8 kg)   LMP 11/22/2020 (Exact Date)   SpO2 98%   BMI 29.23 kg/m   Results for orders placed or performed in visit on 01/16/21 (from the past 24 hour(s))  POCT urine pregnancy     Status: None   Collection Time: 01/16/21 11:49 AM  Result Value Ref Range   Preg Test, Ur Negative Negative   General: Appears well, no acute distress. Age appropriate. Pelvic exam: normal external genitalia, vulva, vagina, cervix, uterus and adnexa, PAP: Pap smear done today, DNA probe for chlamydia and GC obtained, HPV test, exam chaperoned by 01/18/21, CMA.  ASSESSMENT/PLAN:   1. Cervical cancer screening - Cytology - PAP([place])  2. Encounter for initial prescription of contraceptive pills Discussed that patient may be perimenopausal and she needs to be evaluated at least once a year for continuation of OCPs. - POCT urine pregnancy - Norethindrone-Ethinyl Estradiol-Fe Biphas (LO LOESTRIN FE) 1 MG-10 MCG / 10 MCG tablet; Take 1 tablet by mouth daily.  Dispense: 28 tablet; Refill: 11  3. Essential hypertension - lisinopril (ZESTRIL) 30 MG tablet; Take 1 tablet (30 mg total) by mouth daily.  Dispense: 90 tablet; Refill: 1'   Gilberto Better, DO [facility]

## 2021-01-16 ENCOUNTER — Encounter: Payer: Self-pay | Admitting: Family Medicine

## 2021-01-16 ENCOUNTER — Other Ambulatory Visit: Payer: Self-pay

## 2021-01-16 ENCOUNTER — Ambulatory Visit (INDEPENDENT_AMBULATORY_CARE_PROVIDER_SITE_OTHER): Payer: 59 | Admitting: Family Medicine

## 2021-01-16 ENCOUNTER — Other Ambulatory Visit (HOSPITAL_COMMUNITY)
Admission: RE | Admit: 2021-01-16 | Discharge: 2021-01-16 | Disposition: A | Discharge status: Home / Self Care | Payer: 59 | Source: Ambulatory Visit | Attending: Family Medicine | Admitting: Family Medicine

## 2021-01-16 ENCOUNTER — Other Ambulatory Visit (HOSPITAL_COMMUNITY): Payer: Self-pay

## 2021-01-16 VITALS — BP 110/70 | HR 78 | Ht 63.0 in | Wt 165.0 lb

## 2021-01-16 DIAGNOSIS — I10 Essential (primary) hypertension: Secondary | ICD-10-CM | POA: Diagnosis not present

## 2021-01-16 DIAGNOSIS — Z124 Encounter for screening for malignant neoplasm of cervix: Secondary | ICD-10-CM | POA: Insufficient documentation

## 2021-01-16 DIAGNOSIS — Z30011 Encounter for initial prescription of contraceptive pills: Secondary | ICD-10-CM

## 2021-01-16 DIAGNOSIS — Z1211 Encounter for screening for malignant neoplasm of colon: Secondary | ICD-10-CM

## 2021-01-16 LAB — POCT URINE PREGNANCY: Preg Test, Ur: NEGATIVE

## 2021-01-16 MED ORDER — LISINOPRIL 30 MG PO TABS
30.0000 mg | ORAL_TABLET | Freq: Every day | ORAL | 1 refills | Status: DC
Start: 2021-01-16 — End: 2021-10-22
  Filled 2021-01-16: qty 90, 90d supply, fill #0
  Filled 2021-05-01: qty 90, 90d supply, fill #1

## 2021-01-16 MED ORDER — LO LOESTRIN FE 1 MG-10 MCG / 10 MCG PO TABS
1.0000 | ORAL_TABLET | Freq: Every day | ORAL | 11 refills | Status: DC
  Filled 2021-01-16: qty 28, 28d supply, fill #0
  Filled 2021-02-12: qty 28, 28d supply, fill #1
  Filled 2021-03-12: qty 28, 28d supply, fill #2
  Filled 2021-04-11: qty 28, 28d supply, fill #3
  Filled 2021-05-01: qty 28, 28d supply, fill #4
  Filled 2021-06-04: qty 28, 28d supply, fill #5
  Filled 2021-07-02: qty 28, 28d supply, fill #6
  Filled 2021-07-30: qty 28, 28d supply, fill #7
  Filled 2021-08-29: qty 28, 28d supply, fill #8
  Filled 2021-09-27: qty 28, 28d supply, fill #9
  Filled 2021-10-22: qty 28, 28d supply, fill #10

## 2021-01-16 NOTE — Patient Instructions (Signed)
Thank you for coming in today. Today we did your Pap and additional testing.  When this is available I will notify you either via telephone or letter. When ready please call back to get your colonoscopy scheduled. Follow-up in 1 year with your primary care doctor to reassess the need for birth control pills.   Dr. Salvadore Dom

## 2021-01-19 LAB — CYTOLOGY - PAP
Chlamydia: NEGATIVE
Comment: NEGATIVE
Comment: NEGATIVE
Comment: NEGATIVE
Comment: NORMAL
Diagnosis: NEGATIVE
High risk HPV: NEGATIVE
Neisseria Gonorrhea: NEGATIVE
Trichomonas: NEGATIVE

## 2021-01-20 ENCOUNTER — Encounter: Payer: Self-pay | Admitting: Family Medicine

## 2021-02-05 ENCOUNTER — Other Ambulatory Visit (HOSPITAL_COMMUNITY): Payer: Self-pay

## 2021-02-12 ENCOUNTER — Other Ambulatory Visit: Payer: Self-pay | Admitting: Internal Medicine

## 2021-02-12 ENCOUNTER — Other Ambulatory Visit (HOSPITAL_COMMUNITY): Payer: Self-pay

## 2021-02-12 ENCOUNTER — Other Ambulatory Visit: Payer: Self-pay | Admitting: Family Medicine

## 2021-02-12 DIAGNOSIS — R058 Other specified cough: Secondary | ICD-10-CM

## 2021-02-12 DIAGNOSIS — R0981 Nasal congestion: Secondary | ICD-10-CM

## 2021-02-13 ENCOUNTER — Other Ambulatory Visit (HOSPITAL_COMMUNITY): Payer: Self-pay

## 2021-02-13 MED ORDER — OMEPRAZOLE 20 MG PO CPDR
20.0000 mg | DELAYED_RELEASE_CAPSULE | Freq: Every day | ORAL | 3 refills | Status: DC
  Filled 2021-02-13: qty 30, 30d supply, fill #0

## 2021-02-21 ENCOUNTER — Other Ambulatory Visit (HOSPITAL_COMMUNITY): Payer: Self-pay

## 2021-02-23 ENCOUNTER — Other Ambulatory Visit (HOSPITAL_COMMUNITY): Payer: Self-pay

## 2021-03-07 ENCOUNTER — Other Ambulatory Visit: Payer: Self-pay

## 2021-03-07 ENCOUNTER — Ambulatory Visit (INDEPENDENT_AMBULATORY_CARE_PROVIDER_SITE_OTHER): Payer: 59 | Admitting: Family Medicine

## 2021-03-07 VITALS — BP 138/90 | HR 83 | Wt 164.5 lb

## 2021-03-07 DIAGNOSIS — R109 Unspecified abdominal pain: Secondary | ICD-10-CM | POA: Diagnosis not present

## 2021-03-07 DIAGNOSIS — Z23 Encounter for immunization: Secondary | ICD-10-CM

## 2021-03-07 LAB — POCT UA - MICROSCOPIC ONLY

## 2021-03-07 LAB — POCT URINALYSIS DIP (MANUAL ENTRY)
Bilirubin, UA: NEGATIVE
Glucose, UA: NEGATIVE mg/dL
Ketones, POC UA: NEGATIVE mg/dL
Leukocytes, UA: NEGATIVE
Nitrite, UA: NEGATIVE
Protein Ur, POC: NEGATIVE mg/dL
Spec Grav, UA: <=1.005 — AB (ref 1.010–1.025)
Urobilinogen, UA: 0.2 E.U./dL
pH, UA: 6 (ref 5.0–8.0)

## 2021-03-07 NOTE — Patient Instructions (Addendum)
It was nice seeing you today!  Go to your ultrasound as scheduled. If your blood work looks normal, I will send you a letter in the mail. Otherwise, I will give you a call.  Follow-up in 4 weeks or return sooner if needed. Go to the ED if you develop fever or blood in your stool or vomit.  Please arrive at least 15 minutes prior to your scheduled appointments. --- Michelle Clay agradable verte hoy!  Vaya a su ultrasonido segn lo programado. Si su anlisis de [employer] normal, le enviar una carta por correo. De lo contrario, te llamar.  Seguimiento en 4 semanas o regreso antes si es necesario. Vaya al servicio de urgencias si presenta fiebre o sangre en las heces o vmitos.  Llegue al menos 15 minutos antes de sus citas programadas.  Stay well, Michelle Deeds, MD [facility] [phone]

## 2021-03-07 NOTE — Progress Notes (Signed)
    SUBJECTIVE:   CHIEF COMPLAINT / HPI: lower abdominal pain  Spanish audio interpreter used  Reports intermittent lower abdominal pain and periumbilical for several days which is worse with eating. Pain was severe 4 days ago but has been gradually improving. She has had vomiting after eating. Has been taking omeprazole which is not helping. Occasional ibuprofen use. Period started 8 days ago and is still ongoing, improving though. Has constipation, last BM was today but a small amount with hard stools - ongoing for months. Had one episode of small amount of blood in vomit but none since then. Denies nausea, fever, vaginal discharge, blood in stool.  Denies alcohol use and tobacco use.  Discuss screening colonoscopy but patient declined.  PERTINENT  PMH / PSH: HTN  OBJECTIVE:   BP 138/90   Pulse 83   Wt 164 lb 8 oz (74.6 kg)   SpO2 100%   BMI 29.14 kg/m   General: Alert, NAD CV: RRR, no murmurs Pulm: CTAB, no wheezes or rales Abdomen: Soft, mildly tender in the lower quadrants primarily periumbilical, no guarding, negative Murphy sign, positive bowel sounds  ASSESSMENT/PLAN:   Acute abdominal pain Ongoing for several days intermittent and primarily lower abdomen and periumbilical region appears to be related to eating.  Mildly tender on exam but overall benign.  UA showing blood which is likely from menstrual period.  Differential is broad and includes early appendicitis, symptomatic cholelithiasis, pancreatitis, diverticulitis, PUD, pelvic etiology. - abdominal US - labs: CBC, CMP, lipase     Littie Deeds, MD [facility]

## 2021-03-08 ENCOUNTER — Encounter: Payer: Self-pay | Admitting: Family Medicine

## 2021-03-08 LAB — COMPREHENSIVE METABOLIC PANEL
ALT: 19 IU/L (ref 0–32)
AST: 18 IU/L (ref 0–40)
Albumin/Globulin Ratio: 1.9 (ref 1.2–2.2)
Albumin: 4.6 g/dL (ref 3.8–4.9)
Alkaline Phosphatase: 64 IU/L (ref 44–121)
BUN/Creatinine Ratio: 15 (ref 9–23)
BUN: 9 mg/dL (ref 6–24)
Bilirubin Total: <0.2 mg/dL (ref 0.0–1.2)
CO2: 20 mmol/L (ref 20–29)
Calcium: 9.7 mg/dL (ref 8.7–10.2)
Chloride: 101 mmol/L (ref 96–106)
Creatinine, Ser: 0.59 mg/dL (ref 0.57–1.00)
Globulin, Total: 2.4 g/dL (ref 1.5–4.5)
Glucose: 93 mg/dL (ref 70–99)
Potassium: 4.3 mmol/L (ref 3.5–5.2)
Sodium: 139 mmol/L (ref 134–144)
Total Protein: 7 g/dL (ref 6.0–8.5)
eGFR: 109 mL/min/{1.73_m2} (ref >59)

## 2021-03-08 LAB — CBC WITH DIFFERENTIAL/PLATELET
Basophils Absolute: 0.1 10*3/uL (ref 0.0–0.2)
Basos: 1 %
EOS (ABSOLUTE): 0.1 10*3/uL (ref 0.0–0.4)
Eos: 1 %
Hematocrit: 38.3 % (ref 34.0–46.6)
Hemoglobin: 13.3 g/dL (ref 11.1–15.9)
Immature Grans (Abs): 0 10*3/uL (ref 0.0–0.1)
Immature Granulocytes: 0 %
Lymphocytes Absolute: 2.8 10*3/uL (ref 0.7–3.1)
Lymphs: 32 %
MCH: 32.2 pg (ref 26.6–33.0)
MCHC: 34.7 g/dL (ref 31.5–35.7)
MCV: 93 fL (ref 79–97)
Monocytes Absolute: 0.8 10*3/uL (ref 0.1–0.9)
Monocytes: 9 %
Neutrophils Absolute: 5 10*3/uL (ref 1.4–7.0)
Neutrophils: 57 %
Platelets: 289 10*3/uL (ref 150–450)
RBC: 4.13 x10E6/uL (ref 3.77–5.28)
RDW: 12.1 % (ref 11.7–15.4)
WBC: 8.8 10*3/uL (ref 3.4–10.8)

## 2021-03-08 LAB — LIPASE: Lipase: 39 U/L (ref 14–72)

## 2021-03-12 ENCOUNTER — Other Ambulatory Visit (HOSPITAL_COMMUNITY): Payer: Self-pay

## 2021-03-21 ENCOUNTER — Ambulatory Visit
Admission: RE | Admit: 2021-03-21 | Discharge: 2021-03-21 | Disposition: A | Discharge status: Home / Self Care | Payer: 59 | Source: Ambulatory Visit | Attending: Family Medicine | Admitting: Family Medicine

## 2021-03-21 DIAGNOSIS — R109 Unspecified abdominal pain: Secondary | ICD-10-CM

## 2021-03-23 ENCOUNTER — Encounter: Payer: Self-pay | Admitting: Family Medicine

## 2021-04-06 ENCOUNTER — Other Ambulatory Visit: Payer: Self-pay

## 2021-04-06 ENCOUNTER — Ambulatory Visit (INDEPENDENT_AMBULATORY_CARE_PROVIDER_SITE_OTHER): Payer: No Typology Code available for payment source | Admitting: Family Medicine

## 2021-04-06 VITALS — BP 130/64 | HR 64 | Ht 63.0 in | Wt 163.4 lb

## 2021-04-06 DIAGNOSIS — Z1211 Encounter for screening for malignant neoplasm of colon: Secondary | ICD-10-CM | POA: Diagnosis not present

## 2021-04-06 DIAGNOSIS — K760 Fatty (change of) liver, not elsewhere classified: Secondary | ICD-10-CM

## 2021-04-06 DIAGNOSIS — K219 Gastro-esophageal reflux disease without esophagitis: Secondary | ICD-10-CM | POA: Diagnosis not present

## 2021-04-06 DIAGNOSIS — I10 Essential (primary) hypertension: Secondary | ICD-10-CM | POA: Diagnosis not present

## 2021-04-06 NOTE — Assessment & Plan Note (Signed)
-  BP 130/64, at goal -continue lisinopril  -diet and exercise counseling provided, handout provided

## 2021-04-06 NOTE — Assessment & Plan Note (Signed)
-  abdominal pain also likely due to history of GERD -continue omeprazole

## 2021-04-06 NOTE — Assessment & Plan Note (Signed)
-  prior ultrasound notable for hepatic steatosis  -extensive diet and exercise counseling provided, discussed NASH diet, limit if not avoid fatty foods, encouraged fruits, vegetables, lean protein  -Most recent lipid panel notable for cholesterol of 209 and LDL of 138. ASCVD risk 2.2%, did not initiate statin therapy.

## 2021-04-06 NOTE — Patient Instructions (Addendum)
  Fue genial verte hoy!  Hoy hablamos de su presin arterial y dolor abdominal. Su presin arterial se ve bien hoy, contine tomando su lisinopril. En cuanto a tu dolor abdominal, creo que es por tener esteatosis heptica que mejorar con dieta y ejercicio. Por favor, siga la dieta que discutimos.  Haga un seguimiento en su prxima cita programada en 6 meses, si surge algo entre ahora y [place], no dude en comunicarse con nuestra oficina.   Gracias por permitirnos ser parte de su atencin mdica!  Gracias, Dra. Robyne Peers  It was great seeing you today!  Today we discussed your blood pressure and abdominal pain. Your blood pressure looks good today, please continue to take your lisinopril. Regarding your abdominal pain, I believe this is due to having fatty liver disease which will improve with diet and exercise. Please follow the diet that we discussed.   Please follow up at your next scheduled appointment in 6 months, if anything arises between now and then, please don't hesitate to contact our office.   Thank you for allowing [place] to be a part of your medical care!  Thank you, Dr. Robyne Peers

## 2021-04-06 NOTE — Progress Notes (Signed)
    SUBJECTIVE:   Patient presents for follow up after experiencing abdominal pain a few weeks ago. Endorsing periumbilical and epigastric pain with occasional burning sensation that has now improved but still persists intermittently. Eating makes it worse and nothing in particular makes it better. She would like to review the imaging results performed at the time. Tries to eat healthy but ends up eating fatty, fried foods.   Patient with a history of hypertension. Endorses compliance on lisinopril daily. Denies chest pain, dyspnea and leg swelling. Checks blood pressure at home often which are within normal range.   OBJECTIVE:   BP 130/64   Pulse 64   Ht 5\' 3"  (1.6 m)   Wt 163 lb 6.4 oz (74.1 kg)   SpO2 100%   BMI 28.95 kg/m   General: Patient well-appearing, in no acute distress.  CV: RRR, no murmurs or gallops Resp: CTAB Abdomen: soft, nontender, nondistended, presence of bowel sounds Ext: radial pulses strong and equal bilaterally, no LE edema noted bilaterally  ASSESSMENT/PLAN:   Hepatic steatosis -prior ultrasound notable for hepatic steatosis  -extensive diet and exercise counseling provided, discussed NASH diet, limit if not avoid fatty foods, encouraged fruits, vegetables, lean protein  -Most recent lipid panel notable for cholesterol of 209 and LDL of 138. ASCVD risk 2.2%, did not initiate statin therapy.   Essential hypertension -BP 130/64, at goal -continue lisinopril  -diet and exercise counseling provided, handout provided   GERD (gastroesophageal reflux disease) -abdominal pain also likely due to history of GERD -continue omeprazole    Health maintenance -Patient due for colonoscopy screening, GI referral placed with patient consent.   , DO [facility]

## 2021-04-11 ENCOUNTER — Other Ambulatory Visit (HOSPITAL_COMMUNITY): Payer: Self-pay

## 2021-05-01 ENCOUNTER — Other Ambulatory Visit (HOSPITAL_COMMUNITY): Payer: Self-pay

## 2021-05-02 ENCOUNTER — Other Ambulatory Visit (HOSPITAL_COMMUNITY): Payer: Self-pay

## 2021-06-04 ENCOUNTER — Other Ambulatory Visit (HOSPITAL_COMMUNITY): Payer: Self-pay

## 2021-07-02 ENCOUNTER — Other Ambulatory Visit (HOSPITAL_COMMUNITY): Payer: Self-pay

## 2021-07-30 ENCOUNTER — Other Ambulatory Visit (HOSPITAL_COMMUNITY): Payer: Self-pay

## 2021-08-29 ENCOUNTER — Other Ambulatory Visit (HOSPITAL_COMMUNITY): Payer: Self-pay

## 2021-09-24 ENCOUNTER — Other Ambulatory Visit: Payer: Self-pay | Admitting: Family Medicine

## 2021-09-24 DIAGNOSIS — Z1231 Encounter for screening mammogram for malignant neoplasm of breast: Secondary | ICD-10-CM

## 2021-09-27 ENCOUNTER — Ambulatory Visit: Payer: Self-pay

## 2021-09-27 ENCOUNTER — Other Ambulatory Visit (HOSPITAL_COMMUNITY): Payer: Self-pay

## 2021-10-22 ENCOUNTER — Other Ambulatory Visit (HOSPITAL_COMMUNITY): Payer: Self-pay

## 2021-10-22 ENCOUNTER — Other Ambulatory Visit: Payer: Self-pay | Admitting: Family Medicine

## 2021-10-22 DIAGNOSIS — I10 Essential (primary) hypertension: Secondary | ICD-10-CM

## 2021-10-22 MED ORDER — LISINOPRIL 30 MG PO TABS
30.0000 mg | ORAL_TABLET | Freq: Every day | ORAL | 1 refills | Status: DC
  Filled 2021-10-22: qty 90, 90d supply, fill #0
  Filled 2022-04-03: qty 90, 90d supply, fill #1

## 2021-10-23 ENCOUNTER — Other Ambulatory Visit (HOSPITAL_COMMUNITY): Payer: Self-pay

## 2022-04-03 ENCOUNTER — Other Ambulatory Visit (HOSPITAL_COMMUNITY): Payer: Self-pay

## 2022-05-24 ENCOUNTER — Encounter (HOSPITAL_BASED_OUTPATIENT_CLINIC_OR_DEPARTMENT_OTHER): Payer: Self-pay

## 2022-05-24 ENCOUNTER — Emergency Department (HOSPITAL_BASED_OUTPATIENT_CLINIC_OR_DEPARTMENT_OTHER): Payer: Commercial Managed Care - HMO | Admitting: Radiology

## 2022-05-24 ENCOUNTER — Emergency Department (HOSPITAL_BASED_OUTPATIENT_CLINIC_OR_DEPARTMENT_OTHER)
Admission: EM | Admit: 2022-05-24 | Discharge: 2022-05-24 | Disposition: A | Discharge status: Home / Self Care | Payer: Commercial Managed Care - HMO | Attending: Emergency Medicine | Admitting: Emergency Medicine

## 2022-05-24 ENCOUNTER — Emergency Department (HOSPITAL_BASED_OUTPATIENT_CLINIC_OR_DEPARTMENT_OTHER): Payer: Commercial Managed Care - HMO

## 2022-05-24 ENCOUNTER — Other Ambulatory Visit: Payer: Self-pay

## 2022-05-24 DIAGNOSIS — S61215A Laceration without foreign body of left ring finger without damage to nail, initial encounter: Secondary | ICD-10-CM | POA: Diagnosis present

## 2022-05-24 DIAGNOSIS — W260XXA Contact with knife, initial encounter: Secondary | ICD-10-CM | POA: Diagnosis not present

## 2022-05-24 DIAGNOSIS — Y93G1 Activity, food preparation and clean up: Secondary | ICD-10-CM | POA: Insufficient documentation

## 2022-05-24 DIAGNOSIS — S61315S Laceration without foreign body of left ring finger with damage to nail, sequela: Secondary | ICD-10-CM

## 2022-05-24 DIAGNOSIS — Z23 Encounter for immunization: Secondary | ICD-10-CM | POA: Insufficient documentation

## 2022-05-24 MED ORDER — LIDOCAINE HCL (PF) 1 % IJ SOLN
10.0000 mL | Freq: Once | INTRAMUSCULAR | Status: AC
  Administered 2022-05-24: 10 mL
  Filled 2022-05-24: qty 10

## 2022-05-24 MED ORDER — TETANUS-DIPHTH-ACELL PERTUSSIS 5-2.5-18.5 LF-MCG/0.5 IM SUSY
0.5000 mL | PREFILLED_SYRINGE | Freq: Once | INTRAMUSCULAR | Status: AC
  Administered 2022-05-24: 0.5 mL via INTRAMUSCULAR
  Filled 2022-05-24: qty 0.5

## 2022-05-24 NOTE — ED Provider Triage Note (Signed)
Emergency Medicine Provider Triage Evaluation Note  Jeryl Wilbourn , a 52 y.o. female  was evaluated in triage.  Pt complains of cutting her left ring finger about 1.5 hours ago while cutting potatoes. Does not remember last tetanus shot..denies blood thinner use. States her finger feels mildly numb at the distal tip.   Review of Systems  Positive: See above Negative:   Physical Exam  There were no vitals taken for this visit. Gen:   Awake, no distress   Resp:  Normal effort  MSK:   Moves extremities without difficulty  Other:  Mild decreased ROM to the left fourth digit. Laceration between the PIP and DIP that is about 1-2cm   Medical Decision Making  Medically screening exam initiated at 5:29 PM.  Appropriate orders placed.  Brittanyann Wittner was informed that the remainder of the evaluation will be completed by another provider, this initial triage assessment does not replace that evaluation, and the importance of remaining in the ED until their evaluation is complete.     Mickie Hillier, PA-C 05/24/22 1732

## 2022-05-24 NOTE — ED Notes (Signed)
Wound care performed on left ring finger and left pinky finger. Open lac with swelling noted to left ring finger between 1st and 2nd joint and closed superficial lac noted to tip of left pinky finger. Bleeding is controlled. Patient tolerated the cleaning well.

## 2022-05-24 NOTE — Discharge Instructions (Addendum)
Please follow-up with your PCP in 7-10 days for suture removal. If you develop redness, swelling, or pain to the area around your sutures please return to the ED. Do not get sutures wet for first 24 hours.

## 2022-05-24 NOTE — ED Provider Notes (Signed)
[facility] Provider Note   CSN: [id number] Arrival date & time: 05/24/22  1644     History  Chief Complaint  Patient presents with   Laceration    Michelle Clay is a 52 y.o. female, no pertinent past medical history who presents to the ED secondary to laceration to the left ring finger, after cutting it when trying to cut a [place].  Unknown last tetanus shot, denies any numbness tingling to the area     Home Medications Prior to Admission medications   Medication Sig Start Date End Date Taking? Authorizing Provider  lisinopril (ZESTRIL) 30 MG tablet Take 1 tablet (30 mg total) by mouth daily. 10/22/21 07/02/22  Donney Dice, DO  Norethindrone-Ethinyl Estradiol-Fe Biphas (LO LOESTRIN FE) 1 MG-10 MCG / 10 MCG tablet Take 1 tablet by mouth daily. 01/16/21   Autry-Lott, Naaman Plummer, DO  omeprazole (PRILOSEC) 20 MG capsule Take 1 capsule (20 mg total) by mouth daily. 02/13/21   Ganta, Anupa, DO  sucralfate (CARAFATE) 1 GM/10ML suspension Take 10 mLs (1 g total) by mouth 4 (four) times daily -  with meals and at bedtime. Patient not taking: Reported on 12/05/2020 03/18/20   Randal Buba, April, MD      Allergies    Patient has no known allergies.    Review of Systems   Review of Systems  Skin:  Positive for wound.    Physical Exam Updated Vital Signs BP (!) 150/79   Pulse 72   Temp 99.2 F (37.3 C)   Resp 14   Ht 5\' 3"  (1.6 m)   Wt 74.1 kg   SpO2 100%   BMI 28.94 kg/m  Physical Exam Vitals and nursing note reviewed.  Constitutional:      General: She is not in acute distress.    Appearance: She is well-developed.  HENT:     Head: Normocephalic and atraumatic.  Eyes:     Conjunctiva/sclera: Conjunctivae normal.  Cardiovascular:     Pulses: Normal pulses.  Pulmonary:     Breath sounds: Normal breath sounds.  Abdominal:     Palpations: Abdomen is soft.     Tenderness: There is no abdominal tenderness.  Musculoskeletal:         General: No swelling.     Cervical back: Neck supple.     Comments: Left hand: TTP of middle phalanx of digit 4. Radial pulses present. Grip strength intact. Able to flex, extend, ulnar and radial deviate wrist. Two point discrimination intact. Normal thumb opposition. Intact ROM for all MCPs, PIPs, and DIPs.  No snuffbox ttp. No sensory deficits. Capillary refill <2sec   Skin:    General: Skin is warm and dry.     Capillary Refill: Capillary refill takes less than 2 seconds.     Comments: 3cm laceration to middle phalanx of digit 4  Neurological:     Mental Status: She is alert.  Psychiatric:        Mood and Affect: Mood normal.     ED Results / Procedures / Treatments   Labs (all labs ordered are listed, but only abnormal results are displayed) Labs Reviewed - No data to display  EKG None  Radiology DG Hand Complete Left  Result Date: 05/24/2022 CLINICAL DATA:  Laceration fourth digit, injured cutting a potato EXAM: LEFT HAND - COMPLETE 3+ VIEW COMPARISON:  LEFT wrist radiographs 05/31/2008 FINDINGS: Dressing artifacts at LEFT ring finger. Osseous mineralization normal. Joint spaces preserved. No acute fracture, dislocation, or bone  destruction. No metallic foreign bodies identified. IMPRESSION: No acute osseous abnormalities. Electronically Signed   By: Lavonia Dana M.D.   On: 05/24/2022 18:04    Procedures .Marland KitchenLaceration Repair  Date/Time: 05/24/2022 9:47 PM  Performed by: Osvaldo Shipper, PA Authorized by: Osvaldo Shipper, PA   Consent:    Consent obtained:  Verbal   Consent given by:  Patient   Risks, benefits, and alternatives were discussed: yes     Risks discussed:  Infection, pain, poor cosmetic result, tendon damage, poor wound healing, nerve damage, need for additional repair and vascular damage Universal protocol:    Patient identity confirmed:  Arm band Anesthesia:    Anesthesia method:  Nerve block   Block location:  3rd left finger   Block needle gauge:  27  G   Block anesthetic:  Lidocaine 1% w/o epi   Block technique:  Digital Laceration details:    Location:  Finger   Finger location:  L ring finger   Length (cm):  3 Treatment:    Area cleansed with:  Povidone-iodine   Amount of cleaning:  Standard   Irrigation solution:  Sterile saline   Irrigation method:  Pressure wash Skin repair:    Repair method:  Sutures   Suture size:  5-0   Suture material:  Prolene   Number of sutures:  6 Repair type:    Repair type:  Simple Post-procedure details:    Dressing:  Non-adherent dressing     Medications Ordered in ED Medications  Tdap (BOOSTRIX) injection 0.5 mL (0.5 mLs Intramuscular Given 05/24/22 1733)  lidocaine (PF) (XYLOCAINE) 1 % injection 10 mL (10 mLs Other Given by Other 05/24/22 2145)    ED Course/ Medical Decision Making/ A&P                           Medical Decision Making Patient is a 52 year old female, here for a laceration to her left ring finger after cutting it on a heel cup.  Tetanus was updated in triage.  X-ray was ordered and unremarkable.  Will need to suture, sutured 6 sutures present.  Good hemostasis, she declined closure of a Gemma Ruan superficial laceration to the tip of left pinky.  Discussed return precautions, and need for follow-up with primary care in 7 to 10 days for removal of the sutures.  Amount and/or Complexity of Data Reviewed Radiology:     Details: Unremarkable.  Risk Prescription drug management.   Final Clinical Impression(s) / ED Diagnoses Final diagnoses:  Laceration of left ring finger without foreign body with damage to nail, sequela    Rx / DC Orders ED Discharge Orders     None         Phillip Maffei, Si Gaul, PA 05/24/22 2353    Charlesetta Shanks, MD 05/25/22 0041

## 2022-05-24 NOTE — ED Triage Notes (Signed)
Patient here POV from Home.  Endorses Laceration to Left Fourth Digit. Cutting a Potato 1.5 Hours ago and caused 2 cm laceration.   NAD Noted during Triage. A&Ox4. GCS 15. Ambulatory.

## 2022-05-27 ENCOUNTER — Ambulatory Visit (INDEPENDENT_AMBULATORY_CARE_PROVIDER_SITE_OTHER): Payer: Self-pay | Admitting: Family Medicine

## 2022-05-27 ENCOUNTER — Other Ambulatory Visit (HOSPITAL_COMMUNITY): Payer: Self-pay

## 2022-05-27 ENCOUNTER — Encounter: Payer: Self-pay | Admitting: Family Medicine

## 2022-05-27 ENCOUNTER — Ambulatory Visit: Payer: Self-pay | Admitting: Family Medicine

## 2022-05-27 VITALS — BP 138/74 | HR 79 | Ht 63.0 in | Wt 173.2 lb

## 2022-05-27 DIAGNOSIS — M25562 Pain in left knee: Secondary | ICD-10-CM

## 2022-05-27 DIAGNOSIS — I10 Essential (primary) hypertension: Secondary | ICD-10-CM

## 2022-05-27 DIAGNOSIS — G8929 Other chronic pain: Secondary | ICD-10-CM

## 2022-05-27 DIAGNOSIS — S61215D Laceration without foreign body of left ring finger without damage to nail, subsequent encounter: Secondary | ICD-10-CM

## 2022-05-27 MED ORDER — LISINOPRIL 30 MG PO TABS
30.0000 mg | ORAL_TABLET | Freq: Every day | ORAL | 1 refills | Status: DC
  Filled 2022-05-27 – 2022-10-11 (×2): qty 90, 90d supply, fill #0
  Filled 2023-03-10: qty 90, 90d supply, fill #1

## 2022-05-27 NOTE — Patient Instructions (Addendum)
It was nice seeing you today!  I recommend taking Tylenol as needed for knee pain.  If you have a lot of swelling, you can take a little bit of ibuprofen.  Recomiendo tomar Tylenol segn sea necesario para el dolor de rodilla. Si tienes mucha hinchazn puedes tomar un poco de ibuprofeno.  [facility] Address: [address], [place], [place] [address] Phone: [phone]   Stay well, Zola Button, MD [place] [phone]  --  Make sure to check out at the front desk before you leave today.  Please arrive at least 15 minutes prior to your scheduled appointments.  If you had blood work today, I will send you a MyChart message or a letter if results are normal. Otherwise, I will give you a call.  If you had a referral placed, they will call you to set up an appointment. Please give [place] a call if you don't hear back in the next 2 weeks.  If you need additional refills before your next appointment, please call your pharmacy first.

## 2022-05-27 NOTE — Progress Notes (Signed)
    SUBJECTIVE:   CHIEF COMPLAINT / HPI:  Chief Complaint  Patient presents with   Joint Swelling    Patient reports she has had ongoing left knee pain for about 6 months. She has had swelling in the knee and sometimes feels a pop in the knee especially after going up stairs.  Swelling is worse after prolonged periods of standing or walking.  She has not tried anything for the pain. No known injury.  Works at a [employer], product/process development.  She sustained a laceration had to get sutures in the ED a few days ago.  She is requesting to be written out of work for 1 week as she performs a lot of of heavy lifting.  Patient also needs refill of her lisinopril.  She reports she has been adherent to the medication but is running out soon.  PERTINENT  PMH / PSH: HTN, GERD, prediabetes  Patient Care Team: Donney Dice, DO as PCP - General (Family Medicine)   OBJECTIVE:   BP 138/74   Pulse 79   Ht 5\' 3"  (1.6 m)   Wt 173 lb 3.2 oz (78.6 kg)   LMP  (LMP Unknown)   SpO2 100%   BMI 30.68 kg/m   Physical Exam Constitutional:      General: She is not in acute distress. Cardiovascular:     Rate and Rhythm: Normal rate.     Pulses:          Posterior tibial pulses are 2+ on the left side.  Pulmonary:     Effort: Pulmonary effort is normal. No respiratory distress.  Musculoskeletal:     Cervical back: Neck supple.     Comments: There is mild to moderate swelling of the left knee compared to the right.  There is mild tenderness to palpation along the medial joint line and anterior patella.  Full flexion and extension without pain.  Negative varus/valgus stressing.  Negative Lachman.  Negative posterior drawer.  Thessaly test negative bilateral.  Neurological:     Mental Status: She is alert.     Comments: Full strength with flexion and extension of bilateral knees         05/27/2022    3:28 PM  Depression screen PHQ 2/9  Decreased Interest 0  Down, Depressed, Hopeless 0  PHQ - 2 Score 0  Altered  sleeping 0  Tired, decreased energy 0  Change in appetite 0  Feeling bad or failure about yourself  0  Trouble concentrating 0  Moving slowly or fidgety/restless 0  Suicidal thoughts 0  PHQ-9 Score 0  Difficult doing work/chores Not difficult at all     {Show previous vital signs (optional):23777}    ASSESSMENT/PLAN:   Essential hypertension Adequately controlled, at goal. - lisinopril refilled - BMP  Chronic pain of left knee Chronic left knee pain with intermittent swelling suspect this is most likely OA.  Exam is overall unremarkable.  Will evaluate further with imaging.  Treat conservatively for now. - XR bilateral knees standing - acetaminophen prn, can use ibuprofen prn with swelling - if not improving at follow-up could consider PT and/or corticosteroid injection   Laceration of the left ring finger Work note provided, scheduled for suture removal.  Needs removal 7 to 10 days after it was placed 1/19  Return in about 3 months (around 08/26/2022) for f/u HTN.   Zola Button, MD [place]

## 2022-05-28 ENCOUNTER — Ambulatory Visit
Admission: RE | Admit: 2022-05-28 | Discharge: 2022-05-28 | Disposition: A | Discharge status: Home / Self Care | Payer: Commercial Managed Care - HMO | Source: Ambulatory Visit | Attending: Family Medicine | Admitting: Family Medicine

## 2022-05-28 DIAGNOSIS — G8929 Other chronic pain: Secondary | ICD-10-CM | POA: Insufficient documentation

## 2022-05-28 LAB — BASIC METABOLIC PANEL
BUN/Creatinine Ratio: 21 (ref 9–23)
BUN: 12 mg/dL (ref 6–24)
CO2: 23 mmol/L (ref 20–29)
Calcium: 9.7 mg/dL (ref 8.7–10.2)
Chloride: 102 mmol/L (ref 96–106)
Creatinine, Ser: 0.56 mg/dL — ABNORMAL LOW (ref 0.57–1.00)
Glucose: 123 mg/dL — ABNORMAL HIGH (ref 70–99)
Potassium: 4.1 mmol/L (ref 3.5–5.2)
Sodium: 141 mmol/L (ref 134–144)
eGFR: 109 mL/min/{1.73_m2} (ref >59)

## 2022-05-28 NOTE — Assessment & Plan Note (Addendum)
Chronic left knee pain with intermittent swelling suspect this is most likely OA.  Exam is overall unremarkable.  Will evaluate further with imaging.  Treat conservatively for now. - XR bilateral knees standing - acetaminophen prn, can use ibuprofen prn with swelling - if not improving at follow-up could consider PT and/or corticosteroid injection and/or arthrocentesis

## 2022-05-28 NOTE — Assessment & Plan Note (Signed)
Adequately controlled, at goal. - lisinopril refilled - BMP

## 2022-05-29 NOTE — Progress Notes (Signed)
    SUBJECTIVE:   CHIEF COMPLAINT / HPI:  Chief Complaint  Patient presents with   Follow-up    Patient is accompanied by friend who is translating.  Interpreter offered but declined.  Patient is here for suture removal.  She sustained a laceration to the left ring finger she sustained a laceration to the left ring finger sustained when she was trying to cut a [place], seen in the ED on 1/19 and had 6 sutures placed advised to have sutures removed in 7 to 10 days.  Today, patient reports that she is still having some numbness/tingling to the finger that was injured that has been persistent since the initial injury.  I reviewed recent x-ray imaging and lab work with the patient.  PERTINENT  PMH / PSH: HTN, GERD, prediabetes  Patient Care Team: Donney Dice, DO as PCP - General (Family Medicine)   OBJECTIVE:   BP 135/75   Pulse 79   Ht 5\' 3"  (1.6 m)   Wt 172 lb 6.4 oz (78.2 kg)   LMP  (LMP Unknown)   SpO2 100%   BMI 30.54 kg/m   Physical Exam Constitutional:      General: She is not in acute distress. Cardiovascular:     Rate and Rhythm: Normal rate.     Pulses:          Femoral pulses are 2+ on the left side. Pulmonary:     Effort: Pulmonary effort is normal. No respiratory distress.  Musculoskeletal:     Cervical back: Neck supple.  Skin:    Comments: Approximately 3 cm laceration to the left fourth digit middle phalanx with 6 sutures in place without surrounding erythema or purulence.  There is some swelling to the digit.  Neurovascularly intact, able to wiggle fingers.  Neurological:     Mental Status: She is alert.         05/31/2022    8:38 AM  Depression screen PHQ 2/9  Decreased Interest 0  Down, Depressed, Hopeless 0  PHQ - 2 Score 0  Altered sleeping 0  Tired, decreased energy 0  Change in appetite 0  Feeling bad or failure about yourself  0  Trouble concentrating 0  Moving slowly or fidgety/restless 0  Suicidal thoughts 0  PHQ-9 Score 0  Difficult  doing work/chores Not difficult at all      {Show previous vital signs (optional):23777}    ASSESSMENT/PLAN:   Encounter for removal of sutures Left fourth digit laceration occurred 7 days prior, 6 sutures removed today.  Appears to be healing well, discussed aftercare.  Still having some numbness but neurovascularly intact, suspect that this will improve in the next several weeks.  Chronic pain of left knee Reviewed x-ray imaging, largely unremarkable knee x-rays without overt joint space narrowing.  Still suspect primarily OA.  Advised to schedule for possible arthrocentesis and/or corticosteroid injection if swelling and pain are getting worse.    Return if symptoms worsen or fail to improve.   Zola Button, MD [place]

## 2022-05-29 NOTE — Patient Instructions (Addendum)
It was nice seeing you today!  If the swelling in your knee gets worse, schedule a follow-up appointment to discuss further testing of the fluid in your knee.  Si la hinchazn de la rodilla [place], programe una cita de seguimiento para analizar ms pruebas del lquido de la rodilla.  Stay well, Michelle Button, MD [facility] [phone]  --  Make sure to check out at the front desk before you leave today.  Please arrive at least 15 minutes prior to your scheduled appointments.  If you had blood work today, I will send you a MyChart message or a letter if results are normal. Otherwise, I will give you a call.  If you had a referral placed, they will call you to set up an appointment. Please give [place] a call if you don't hear back in the next 2 weeks.  If you need additional refills before your next appointment, please call your pharmacy first.

## 2022-05-31 ENCOUNTER — Encounter: Payer: Self-pay | Admitting: Family Medicine

## 2022-05-31 ENCOUNTER — Ambulatory Visit (INDEPENDENT_AMBULATORY_CARE_PROVIDER_SITE_OTHER): Payer: Commercial Managed Care - HMO | Admitting: Family Medicine

## 2022-05-31 VITALS — BP 135/75 | HR 79 | Ht 63.0 in | Wt 172.4 lb

## 2022-05-31 DIAGNOSIS — Z4802 Encounter for removal of sutures: Secondary | ICD-10-CM

## 2022-05-31 DIAGNOSIS — M25562 Pain in left knee: Secondary | ICD-10-CM | POA: Diagnosis not present

## 2022-05-31 DIAGNOSIS — G8929 Other chronic pain: Secondary | ICD-10-CM | POA: Diagnosis not present

## 2022-05-31 NOTE — Assessment & Plan Note (Addendum)
Reviewed x-ray imaging, largely unremarkable knee x-rays without overt joint space narrowing.  Still suspect primarily OA.  Advised to schedule for possible arthrocentesis and/or corticosteroid injection if swelling and pain are getting worse.

## 2022-08-03 IMAGING — US US ABDOMEN COMPLETE
1 series · 14 of 25 positions shown · non-contrast
Comparison: None.

CLINICAL DATA: Abdominal pain x5 days.

EXAM:
ABDOMEN ULTRASOUND COMPLETE

[Series 1: us abdomen complete · 0.17mm/px · 14 of 103 slices shown]
[im 1/103]
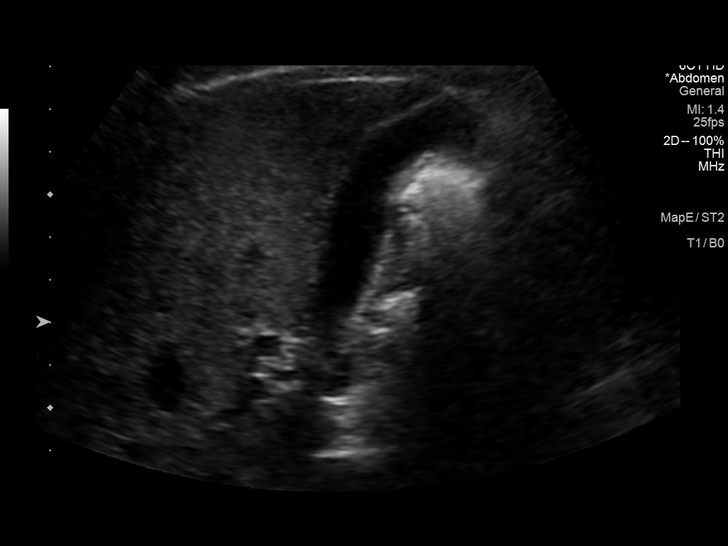
[im 9/103]
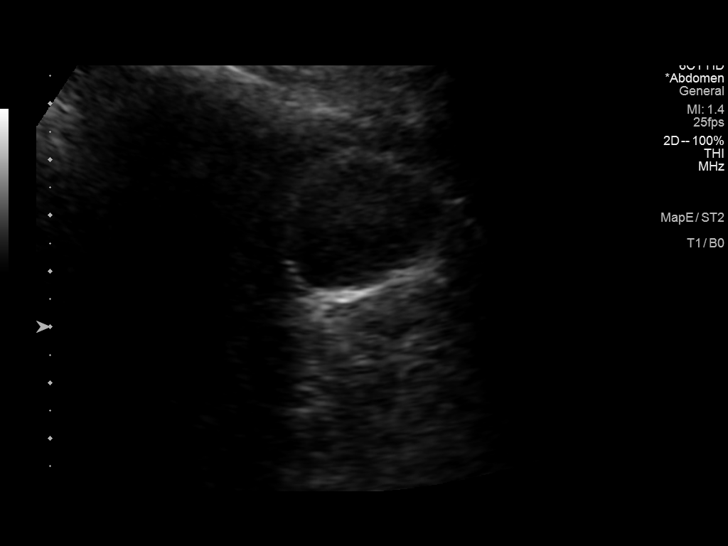
[im 18/103]
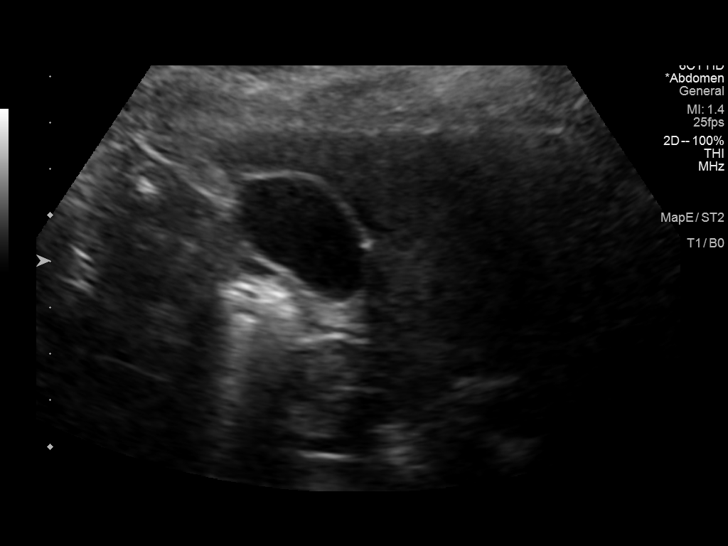
[im 26/103]
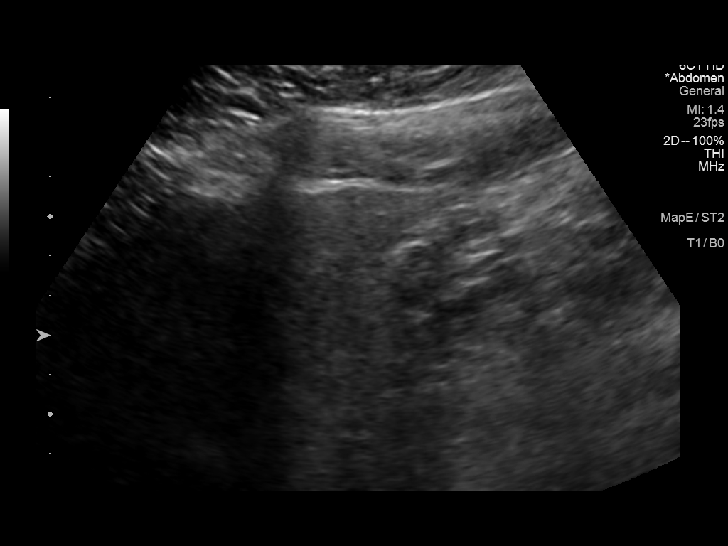
[im 35/103]
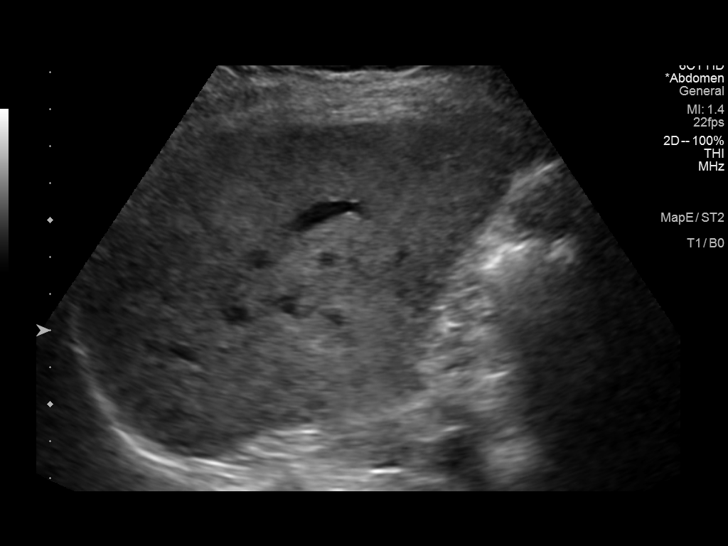
[im 39/103]
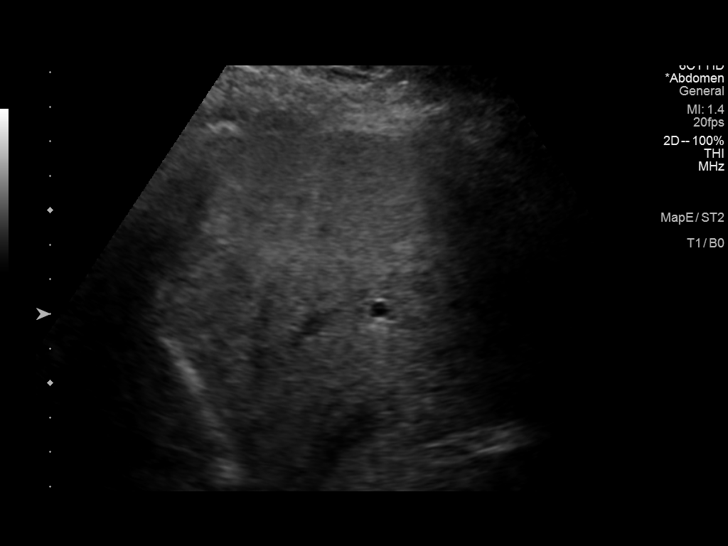
[im 47/103]
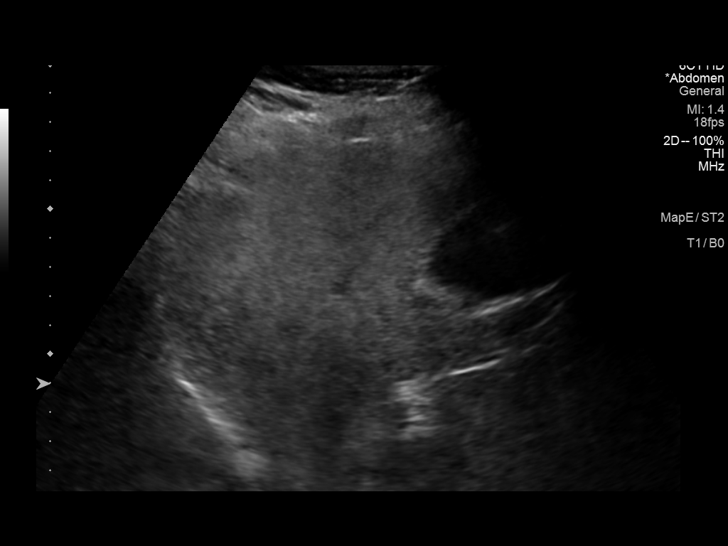
[im 56/103]
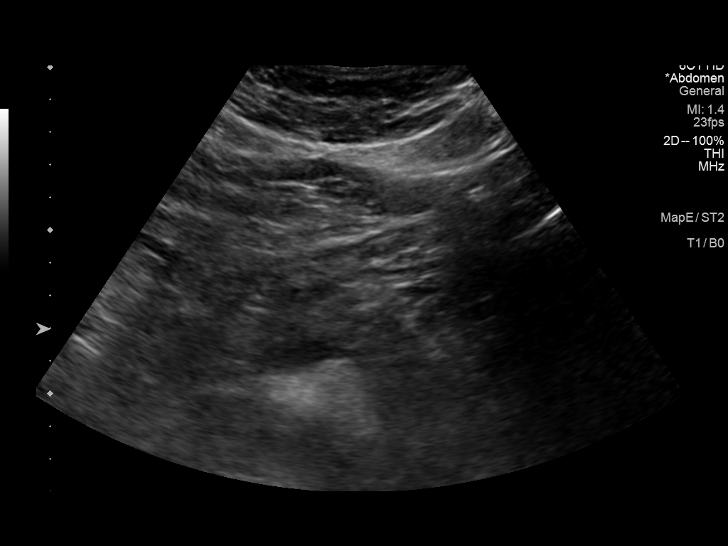
[im 64/103]
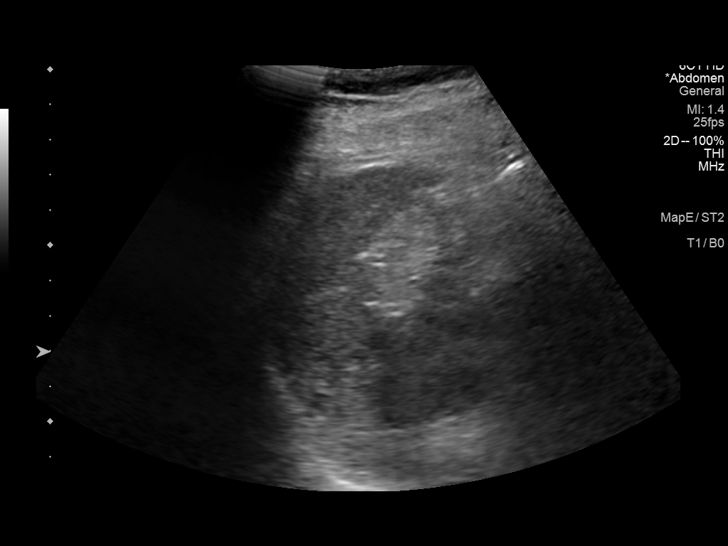
[im 69/103]
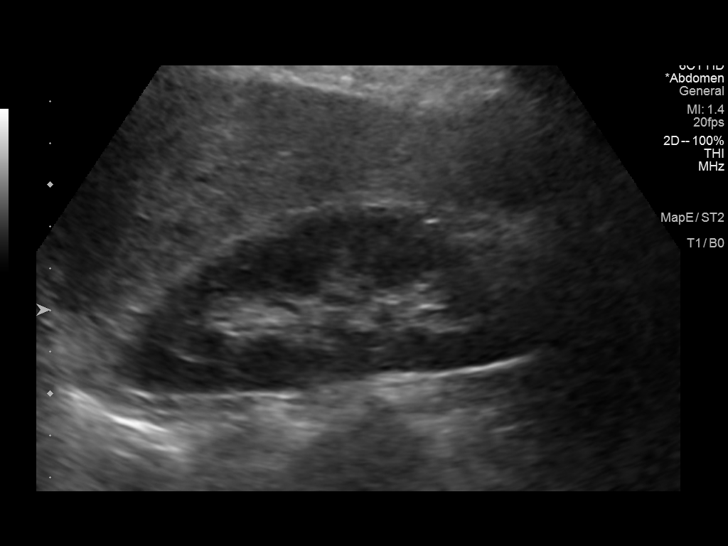
[im 77/103]
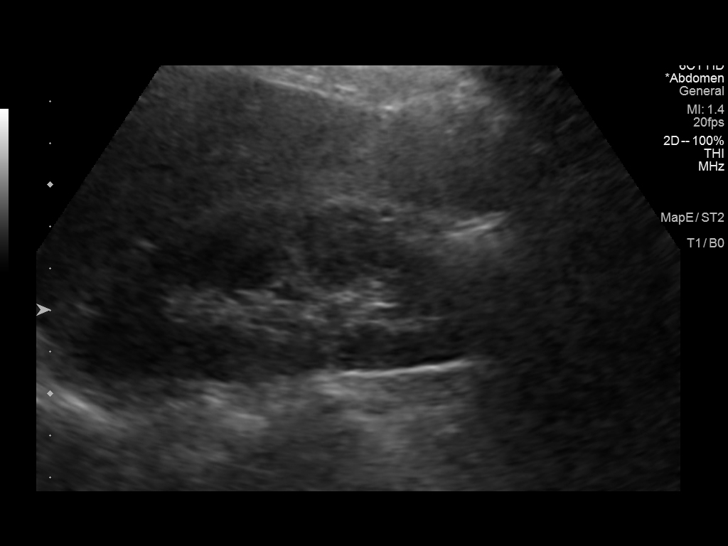
[im 86/103]
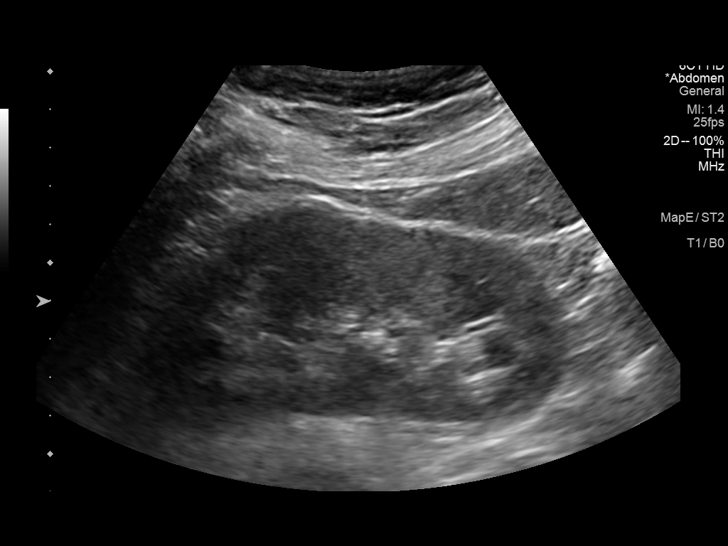
[im 94/103]
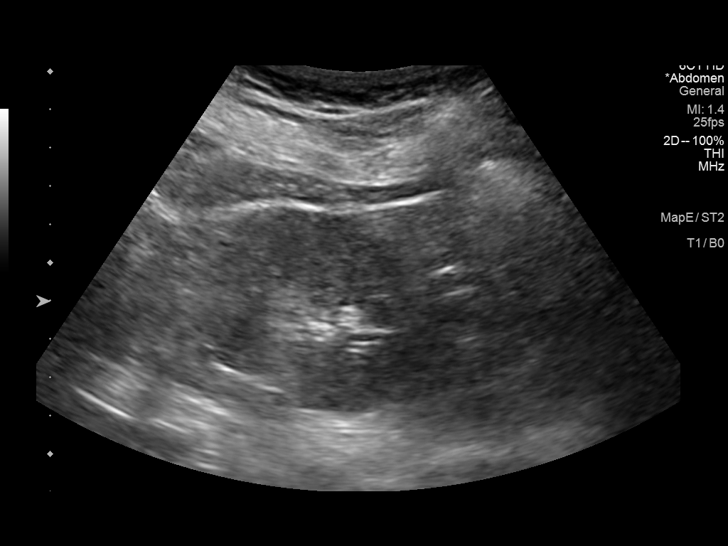
[im 103/103]
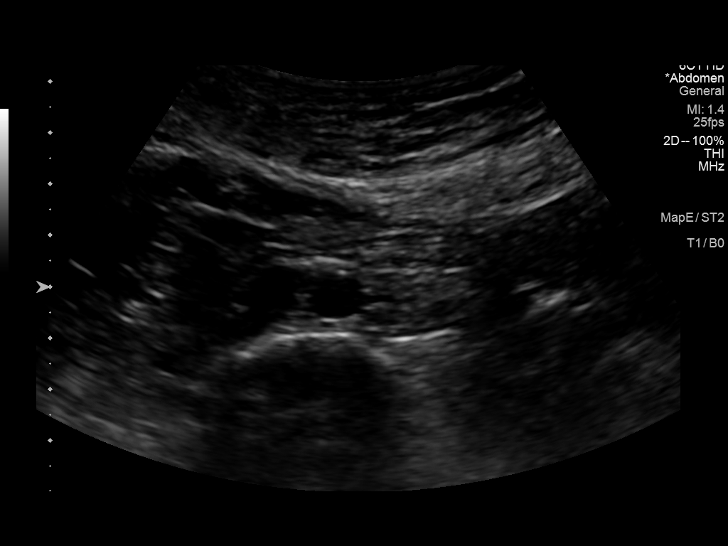

[14 of 25 positions shown; findings below may reference images not displayed]

FINDINGS: Gallbladder: No gallstones or wall thickening visualized (1.3 mm).
No sonographic Murphy sign noted by sonographer.

Common bile duct: Diameter: 3.9 mm

Liver: No focal lesion identified. Diffusely increased echogenicity
of the liver parenchyma is noted. Portal vein is patent on color
Doppler imaging with normal direction of blood flow towards the
liver.

IVC: No abnormality visualized.

Pancreas: Visualized portion unremarkable.

Spleen: Size (4.4 cm) and appearance within normal limits.

Right Kidney: Length: 10.6 cm. Echogenicity within normal limits. No
mass or hydronephrosis visualized.

Left Kidney: Length: 11.3 cm. Echogenicity within normal limits. No
mass or hydronephrosis visualized.

Abdominal aorta: No aneurysm visualized (2.6 cm in AP diameter).

Other findings: None.
IMPRESSION: Findings suggestive of hepatic steatosis without focal liver
lesions.

## 2022-10-11 ENCOUNTER — Other Ambulatory Visit (HOSPITAL_COMMUNITY): Payer: Self-pay

## 2022-12-29 ENCOUNTER — Encounter (HOSPITAL_COMMUNITY): Payer: Self-pay | Admitting: *Deleted

## 2022-12-29 ENCOUNTER — Ambulatory Visit (HOSPITAL_COMMUNITY)
Admission: EM | Admit: 2022-12-29 | Discharge: 2022-12-29 | Disposition: A | Discharge status: Home / Self Care | Payer: BC Managed Care – PPO | Attending: Emergency Medicine | Admitting: Emergency Medicine

## 2022-12-29 DIAGNOSIS — B349 Viral infection, unspecified: Secondary | ICD-10-CM | POA: Diagnosis not present

## 2022-12-29 DIAGNOSIS — U071 COVID-19: Secondary | ICD-10-CM | POA: Insufficient documentation

## 2022-12-29 LAB — POCT RAPID STREP A (OFFICE): Rapid Strep A Screen: NEGATIVE

## 2022-12-29 MED ORDER — IBUPROFEN 800 MG PO TABS: 800.0000 mg | ORAL_TABLET | Freq: Three times a day (TID) | ORAL | 0 refills | Status: DC

## 2022-12-29 NOTE — Discharge Instructions (Addendum)
continuar con ibuprofeno, alternar con tylenol. Estos ayudarn con la fiebre, el dolor de cabeza, el dolor de garganta y los dolores corporales. Pruebe con pastillas o aerosoles para la garganta. la miel tambin puede ser calmante  Los sntomas pueden tardar varios das ms en mejorar  te llamaremos si la prueba de covid es positiva

## 2022-12-29 NOTE — ED Notes (Signed)
Went into room to swab pt for strep and she states she is now having back pain.

## 2022-12-29 NOTE — ED Triage Notes (Signed)
Pt states she has sore throat, body aches, feels like she has a fever at home since yesterday. She took IBU at 1pm today.    Pts son is translating for her.

## 2022-12-29 NOTE — ED Provider Notes (Signed)
[facility]    CSN: [id number] Arrival date & time: 12/29/22  1335      History   Chief Complaint Chief Complaint  Patient presents with   Sore Throat   Generalized Body Aches    HPI Michelle Clay is a 52 y.o. female.  Son interprets per patient request Yesterday developed tactile fever, body aches, sore throat. Throat pain rated 6/10 with swallowing Ibuprofen dose taken about 3 hours ago, some improvement  Denies abd pain, NVD, rash, congestion, cough   Sick contacts unknown. No recent travel   Past Medical History:  Diagnosis Date   Acid reflux    Hypertension     Patient Active Problem List   Diagnosis Date Noted   Chronic pain of left knee 05/28/2022   Hepatic steatosis 04/06/2021   GERD (gastroesophageal reflux disease) 04/06/2021   Cough with congestion of paranasal sinus 11/22/2019   Essential hypertension 08/24/2019   Prediabetes 08/24/2019   Pure hypercholesterolemia 08/24/2019    History reviewed. No pertinent surgical history.  OB History     Gravida  2   Para      Term      Preterm      AB      Living         SAB      IAB      Ectopic      Multiple      Live Births  2            Home Medications    Prior to Admission medications   Medication Sig Start Date End Date Taking? Authorizing Provider  ibuprofen (ADVIL) 800 MG tablet Take 1 tablet (800 mg total) by mouth 3 (three) times daily. 12/29/22  Yes Leocadia Idleman, Lurena Joiner, PA-C  lisinopril (ZESTRIL) 30 MG tablet Take 1 tablet (30 mg total) by mouth daily. 05/27/22 01/09/23 Yes Littie Deeds, MD    Family History Family History  Problem Relation Age of Onset   Hypertension Mother    Hypertension Sister     Social History Social History   Tobacco Use   Smoking status: Never   Smokeless tobacco: Never  Vaping Use   Vaping status: Never Used  Substance Use Topics   Alcohol use: No   Drug use: Never     Allergies   Patient has no known  allergies.   Review of Systems Review of Systems As per HPI  Physical Exam Triage Vital Signs ED Triage Vitals  Encounter Vitals Group     BP 12/29/22 1508 115/76     Systolic BP Percentile --      Diastolic BP Percentile --      Pulse Rate 12/29/22 1508 (!) 107     Resp 12/29/22 1508 18     Temp 12/29/22 1508 98.5 F (36.9 C)     Temp Source 12/29/22 1508 Oral     SpO2 12/29/22 1508 97 %     Weight --      Height --      Head Circumference --      Peak Flow --      Pain Score 12/29/22 1507 6     Pain Loc --      Pain Education --      Exclude from Growth Chart --    No data found.  Updated Vital Signs BP 115/76 (BP Location: Left Arm)   Pulse (!) 107   Temp 98.5 F (36.9 C) (Oral)   Resp 18  LMP  (LMP Unknown)   SpO2 97%   Physical Exam Vitals and nursing note reviewed.  Constitutional:      General: She is not in acute distress. HENT:     Right Ear: Tympanic membrane and ear canal normal.     Left Ear: Tympanic membrane and ear canal normal.     Nose: No congestion or rhinorrhea.     Mouth/Throat:     Mouth: Mucous membranes are moist.     Pharynx: Oropharynx is clear. Posterior oropharyngeal erythema (mild) present.     Tonsils: No tonsillar exudate.  Eyes:     Conjunctiva/sclera: Conjunctivae normal.  Cardiovascular:     Rate and Rhythm: Normal rate and regular rhythm.     Heart sounds: Normal heart sounds.  Pulmonary:     Effort: Pulmonary effort is normal.     Breath sounds: Normal breath sounds.  Musculoskeletal:     Cervical back: Normal range of motion. No rigidity.  Lymphadenopathy:     Cervical: No cervical adenopathy.  Skin:    General: Skin is warm and dry.  Neurological:     Mental Status: She is alert and oriented to person, place, and time.    UC Treatments / Results  Labs (all labs ordered are listed, but only abnormal results are displayed) Labs Reviewed  CULTURE, GROUP A STREP ([facility])  SARS CORONAVIRUS 2 (TAT 6-24 HRS)   POCT RAPID STREP A (OFFICE)    EKG  Radiology No results found.  Procedures Procedures  Medications Ordered in UC Medications - No data to display  Initial Impression / Assessment and Plan / UC Course  I have reviewed the triage vital signs and the nursing notes.  Pertinent labs & imaging results that were available during my care of the patient were reviewed by me and considered in my medical decision making (see chart for details).  Rapid strep negative, culture is pending  Covid test pending.  Discussed likely viral etiology. Symptomatic care at home. Continue ibuprofen, can alternate with tylenol. Throat lozenges or spray as well. Advised may take several days to a week for improvement. Work note provided. Patient agreeable to plan, no questions at this time   Final Clinical Impressions(s) / UC Diagnoses   Final diagnoses:  Viral illness     Discharge Instructions      continuar con ibuprofeno, alternar con tylenol. Estos ayudarn con la fiebre, el dolor de cabeza, el dolor de garganta y los dolores corporales. Pruebe con pastillas o aerosoles para la garganta. la miel tambin puede ser calmante  Los sntomas pueden tardar varios das ms en mejorar  te llamaremos si la prueba de covid es positiva      ED Prescriptions     Medication Sig Dispense Auth. Provider   ibuprofen (ADVIL) 800 MG tablet Take 1 tablet (800 mg total) by mouth 3 (three) times daily. 21 tablet Deasiah Hagberg, Lurena Joiner, PA-C      PDMP not reviewed this encounter.   Azarias Chiou, Lurena Joiner, [place] 12/29/22 2956

## 2022-12-30 LAB — SARS CORONAVIRUS 2 (TAT 6-24 HRS): SARS Coronavirus 2: POSITIVE — AB

## 2023-01-01 LAB — CULTURE, GROUP A STREP (THRC)

## 2023-03-10 ENCOUNTER — Other Ambulatory Visit (HOSPITAL_COMMUNITY): Payer: Self-pay

## 2023-07-28 DIAGNOSIS — Z1231 Encounter for screening mammogram for malignant neoplasm of breast: Secondary | ICD-10-CM | POA: Diagnosis not present

## 2023-07-28 LAB — HM MAMMOGRAPHY

## 2023-09-05 ENCOUNTER — Ambulatory Visit
Admission: RE | Admit: 2023-09-05 | Discharge: 2023-09-05 | Disposition: A | Discharge status: Home / Self Care | Source: Ambulatory Visit | Attending: Family Medicine | Admitting: Family Medicine

## 2023-09-05 ENCOUNTER — Encounter: Payer: Self-pay | Admitting: Student

## 2023-09-05 ENCOUNTER — Ambulatory Visit: Admitting: Student

## 2023-09-05 ENCOUNTER — Other Ambulatory Visit: Payer: Self-pay | Admitting: Student

## 2023-09-05 DIAGNOSIS — I10 Essential (primary) hypertension: Secondary | ICD-10-CM | POA: Diagnosis not present

## 2023-09-05 DIAGNOSIS — M25561 Pain in right knee: Secondary | ICD-10-CM

## 2023-09-05 DIAGNOSIS — G8929 Other chronic pain: Secondary | ICD-10-CM

## 2023-09-05 MED ORDER — DICLOFENAC SODIUM 1 % EX GEL: 2.0000 g | Freq: Four times a day (QID) | CUTANEOUS | 0 refills | Status: DC

## 2023-09-05 MED ORDER — LISINOPRIL 30 MG PO TABS: 30.0000 mg | ORAL_TABLET | Freq: Every day | ORAL | 1 refills | Status: DC

## 2023-09-05 MED ORDER — MELOXICAM 15 MG PO TABS: 15.0000 mg | ORAL_TABLET | Freq: Every day | ORAL | 0 refills | Status: DC

## 2023-09-05 NOTE — Assessment & Plan Note (Signed)
 BP mildly elevated today and patient endorses not taking her medications today.  Encourage compliance to medication, and refill of lisinopril .  Patient to continue ambulatory blood pressure monitoring.

## 2023-09-05 NOTE — Patient Instructions (Addendum)
 Un placer conocerle.  Para su dolor de rodilla, le he recetado meloxicam, un medicamento antiinflamatorio que se toma Building control surveyor al da durante [address]  Tambin le he recetado Voltaren gel, una crema que se aplica sobre la rodilla para Chief Technology Officer.  Le he pedido una radiografa de su rodilla derecha para asegurarme de que no tenga fracturas despus de la cada.  Si la inflamacin persiste o empeora en 2 semanas, por favor, regrese para que podamos aspirar la rodilla y posiblemente inyectarle esteroides.   Pleasure to meet you  For your knee pain I have ordered meloxicam which is an anti-inflammatory medication which you take once daily for 14 days.  I have also sent in Voltaren gel which is a cream you can apply over the knee for the pain.  I have ordered xray of your right knee to make sure you do not have any fractures after your fall.  If swelling continues or gets worse in 2 weeks please return so that we can look to aspirate the knee and possible inject steroid into the knee as well.

## 2023-09-05 NOTE — Progress Notes (Addendum)
    SUBJECTIVE:   CHIEF COMPLAINT / HPI:   Michelle Clay is a 53 year old female who presents with right knee swelling and pain. She has experienced swelling in both knees for five to six months, with the right knee more affected. A fall two months ago worsened her symptoms, leading to increased pain and a sensation of knee locking, especially when climbing stairs. Two weeks ago, she had episodes of numbness in the knee, followed by pain during ambulation. Approximately a year ago, she had knee pain with normal x-rays. She takes lisinopril  for hypertension but missed her dose today and needs a refill.  PERTINENT  PMH / PSH: Reviewed   OBJECTIVE:   BP (!) 146/76   Pulse 76   Ht 5\' 3"  (1.6 m)   Wt 180 lb 4 oz (81.8 kg)   LMP  (LMP Unknown)   SpO2 100%   BMI 31.93 kg/m    Physical Exam General: Alert, well appearing, NAD Cardiovascular: RRR, No Murmurs, Normal S2/S2 Respiratory: CTAB, No wheezing or Rales  R Knee Exam Mild effusion.  No other gross deformity, ecchymoses. TTP on the patella and medial joint line . FROM with normal strength. Negative ant/post drawers. Negative valgus/varus testing. Negative lachman. Negative mcmurrays, apleys, thessalys. NV intact distally.   ASSESSMENT/PLAN:   Chronic pain of right knee Chronic knee swelling and pain, OA, meniscus or Patella injury suspected . Previous X-rays normal. - Prescribe anti-inflammatory medication once daily for 14 days. - Prescribe topical cream for knee pain. - Order knee X-ray at [facility]. - Schedule follow-up in 2 weeks to reassess swelling. - Consider aspiration of knee fluid if swelling persists. - Consider steroid injection if no improvement after aspiration.  Essential hypertension BP mildly elevated today and patient endorses not taking her medications today.  Encourage compliance to medication, and refill of lisinopril .  Patient to continue ambulatory blood pressure monitoring.   Goble Last, MD [facility]

## 2023-09-05 NOTE — Assessment & Plan Note (Signed)
 Chronic knee swelling and pain, OA, meniscus or Patella injury suspected . Previous X-rays normal. - Prescribe anti-inflammatory medication once daily for 14 days. - Prescribe topical cream for knee pain. - Order knee X-ray at [facility]. - Schedule follow-up in 2 weeks to reassess swelling. - Consider aspiration of knee fluid if swelling persists. - Consider steroid injection if no improvement after aspiration.

## 2023-09-10 ENCOUNTER — Telehealth: Payer: Self-pay

## 2023-09-10 NOTE — Telephone Encounter (Signed)
 Received VM from Delia requesting results from recent X-ray.   Returned call to [employer], patient's friend. DPR on file.   Discussed negative X-ray results.   Advised to continue measures discussed by Dr. Mikeal Alder. Advised to keep follow up visit with Dr. Mikeal Alder if pain persists.   Elsie Halo, RN

## 2023-09-16 ENCOUNTER — Ambulatory Visit: Admitting: Student

## 2023-09-16 ENCOUNTER — Encounter: Payer: Self-pay | Admitting: Student

## 2023-09-16 VITALS — BP 134/88 | HR 88 | Ht 63.0 in | Wt 179.1 lb

## 2023-09-16 DIAGNOSIS — G8929 Other chronic pain: Secondary | ICD-10-CM | POA: Diagnosis not present

## 2023-09-16 DIAGNOSIS — M25561 Pain in right knee: Secondary | ICD-10-CM

## 2023-09-16 NOTE — Patient Instructions (Addendum)
 Un placer verte hoy.  La radiografa no mostr artritis, fractura ni la ubicacin de la rodilla.  Dado que an tienes esta rodilla, me preocupa que puedas tener una lesin de menisco que requiera ms estudios de [place].  Sin embargo, he derivado a Music therapist en medicina deportiva para una evaluacin y tratamiento ms exhaustivos de tu rodilla.  Contina tomando meloxicam  hasta que veas a los especialistas en medicina deportiva.   Pleasure to see you today.  Your x-ray did not show any arthritis, fracture or location of your knee.    Being that you are still having this knee I am concerned that you might have a meniscus injury need further imaging.  However have sent an referral to sports medicine for further evaluation and management of your knee.  Continue to take your meloxicam  until you see the sports medicine doctors.

## 2023-09-16 NOTE — Assessment & Plan Note (Signed)
 Negative right knee x-ray.  Expect possible meniscus injury we will place referral to sports medicine for possible ultrasound or MRI of the right knee.  Patient will continue meloxicam  for anti-inflammation and pain control until she sees sports medicine.

## 2023-09-16 NOTE — Progress Notes (Signed)
    SUBJECTIVE:   CHIEF COMPLAINT / HPI:   53 year old female with history of chronic bilateral knee pain presenting for right knee pain follow up. Pain worsening in last 2 few months after a mechanical fall.  Right knee x-ray obtained at last visit was normal. No fracture, dislocation or point narrowing.  Today patient said her meloxicam  was provided mild relief but she rates her pain 8 out of 10.  Also of note she occasionally feels like her knee would lock.   PERTINENT  PMH / PSH: Reviewed   OBJECTIVE:   BP 134/88   Pulse 88   Ht 5\' 3"  (1.6 m)   Wt 179 lb 2 oz (81.3 kg)   LMP  (LMP Unknown)   SpO2 99%   BMI 31.73 kg/m    Physical Exam General: Alert, well appearing, NAD Cardiovascular: RRR, No Murmurs, Normal S2/S2 Respiratory: CTAB, No wheezing or Rales Right knee: No overt deformity, minimal effusion, negative ant/post drawer's, valgus, varus testing.  ASSESSMENT/PLAN:   Chronic pain of right knee Negative right knee x-ray.  Expect possible meniscus injury we will place referral to sports medicine for possible ultrasound or MRI of the right knee.  Patient will continue meloxicam  for anti-inflammation and pain control until she sees sports medicine.     Goble Last, MD [facility]

## 2023-09-22 ENCOUNTER — Ambulatory Visit (INDEPENDENT_AMBULATORY_CARE_PROVIDER_SITE_OTHER): Admitting: Family Medicine

## 2023-09-22 ENCOUNTER — Other Ambulatory Visit: Payer: Self-pay

## 2023-09-22 VITALS — BP 127/76 | Ht 63.0 in | Wt 179.0 lb

## 2023-09-22 DIAGNOSIS — G8929 Other chronic pain: Secondary | ICD-10-CM

## 2023-09-22 DIAGNOSIS — M25561 Pain in right knee: Secondary | ICD-10-CM

## 2023-09-22 MED ORDER — MELOXICAM 15 MG PO TABS: 15.0000 mg | ORAL_TABLET | Freq: Every day | ORAL | 1 refills | Status: AC

## 2023-09-22 NOTE — Patient Instructions (Addendum)
 You have a medial meniscus tear. Start physical therapy and do home exercises on days you don't go to therapy. Meloxicam  daily with food for pain and inflammation. Brace when up and walking around. Icing 15 minutes at a time as needed Follow up with me in 1 month.

## 2023-09-22 NOTE — Progress Notes (Addendum)
 PCP: Genora Kidd, MD  Subjective:   HPI: Patient is a 53 y.o. female here for chronic right knee pain.  Patient had friend present to interpret, declined formal interpreter.  Chronic bilateral knee pain worsened after fall 2 months ago.  Reports she felt a pop during that time and noticed bruising and swelling a week afterwards.  Injury occurred at work, she works at [employer] all day on her feet.  She has had significant pain and intermittent swelling  Seen by PCP on 5/2, noted to have mild knee effusion.  Given NSAIDs and obtained knee XR which showed mild medial joint space narrowing.  5/13 for persistent pain, concern for possible meniscal injury.  Reports NSAIDs help a little bit she has obtained knee brace.  Has been using ice and bought insoles for her shoes.  Past Medical History:  Diagnosis Date   Acid reflux    Hypertension     Current Outpatient Medications on File Prior to Visit  Medication Sig Dispense Refill   diclofenac  Sodium (VOLTAREN ) 1 % GEL Apply 2 g topically 4 (four) times daily. 150 g 0   lisinopril  (ZESTRIL ) 30 MG tablet Take 1 tablet (30 mg total) by mouth daily. 90 tablet 1   meloxicam  (MOBIC ) 15 MG tablet Take 1 tablet (15 mg total) by mouth daily. 30 tablet 0   No current facility-administered medications on file prior to visit.    No past surgical history on file.  No Known Allergies  LMP  (LMP Unknown)       No data to display              No data to display              Objective:  Physical Exam:  Gen: NAD, comfortable in exam room MSK: Right knee: -No erythema, ecchymosis or deformity noted -No joint effusion noted -TTP over medial joint line and extends to infrapatellar region and pes anserine -5/5 hip flexion and knee flexion/extension bilaterally -Negative varus, valgus, anterior and posterior drawer -Positive Thessaly.  Negative mcmurray and apley.  Bedside US : No effusion in suprapatellar  pouch noted.  Posterior medial meniscus with possible small tear with some surrounding inflammation.  Anterior medial meniscus intact.   Assessment & Plan:  1.  Right knee pain: Likely secondary to small posterior medial meniscal tear and overuse standing for long periods of work.  Denies supportive care with ice and knee bracing.  PCP prescribed meloxicam , will send refill.  Referred for formal PT.  Follow-up in 1 month.

## 2023-10-14 ENCOUNTER — Encounter: Payer: Self-pay | Admitting: *Deleted

## 2023-10-23 DIAGNOSIS — M25561 Pain in right knee: Secondary | ICD-10-CM | POA: Diagnosis not present

## 2023-10-27 ENCOUNTER — Ambulatory Visit (INDEPENDENT_AMBULATORY_CARE_PROVIDER_SITE_OTHER): Admitting: Family Medicine

## 2023-10-27 VITALS — BP 132/55 | Ht 63.0 in | Wt 179.0 lb

## 2023-10-27 DIAGNOSIS — G8929 Other chronic pain: Secondary | ICD-10-CM

## 2023-10-27 DIAGNOSIS — M25561 Pain in right knee: Secondary | ICD-10-CM | POA: Diagnosis not present

## 2023-10-27 NOTE — Progress Notes (Unsigned)
 PCP: Christia Budds, MD  Subjective:   HPI: Patient is a 52 y.o. female here for right knee meniscal tear follow-up.  Michelle Clay was last seen on 09/22/2023 where she was diagnosed with a partial medial meniscal tear and recommended to start physical therapy, take meloxicam  daily, and use a knee brace. Since then she states her knee pain has greatly improved however she still has intermittent knee pain and instability particularly with pivots. She started PT this week and has been doing her prescribed exercises. She has not been using a brace for her knee, but has been taking meloxicam  daily. She is not having any numbness, tingling, radicular pain, weakness, catching or locking of the joint.   Past Medical History:  Diagnosis Date   Acid reflux    Hypertension     Current Outpatient Medications on File Prior to Visit  Medication Sig Dispense Refill   diclofenac  Sodium (VOLTAREN ) 1 % GEL Apply 2 g topically 4 (four) times daily. 150 g 0   lisinopril  (ZESTRIL ) 30 MG tablet Take 1 tablet (30 mg total) by mouth daily. 90 tablet 1   meloxicam  (MOBIC ) 15 MG tablet Take 1 tablet (15 mg total) by mouth daily. 30 tablet 1   No current facility-administered medications on file prior to visit.    No past surgical history on file.  No Known Allergies  BP (!) 132/55   Ht 5' 3 (1.6 m)   Wt 179 lb (81.2 kg)   LMP  (LMP Unknown)   BMI 31.71 kg/m       No data to display              No data to display              Objective:  Physical Exam:  Gen: NAD, comfortable in exam room  MSK: Knee Inspection: No bruising, mild swelling at the anteromedial knee Palpation: Mildly tender to medial joint line. No tenderness along patella, lateral joint line.  ROM: Flexion 0-120 Strength: Flexion 5/5, Extension 5/5, Plantarflexion 5/5 Special Tests: Varus/Valgus negative, Lachman negative, Anterior Drawer negative, Posterior Drawer negative, Thessaly equivocal, McMurray negative, Apley's  negative   Assessment & Plan:  Patient is a 53 y.o. female here for medial meniscal partial tear follow-up. Overall it seems that her symptoms have greatly improved but she still has some residual pain and instability. Plan for now is to continue with PT and other exercises to strengthen hamstrings and quadriceps. Also discussed benefit of having a simple knee sleeve to provide compression and reduce swelling.   1. Right Medial Meniscal Partial Tear - PT, exercises for quadriceps and hamstrings - Meloxicam  as needed for pain - Compression sleeve for swelling - Follow-up in 6 weeks or as needed   Bernardino Grip, MS4 [facility]

## 2023-10-29 DIAGNOSIS — M25561 Pain in right knee: Secondary | ICD-10-CM | POA: Diagnosis not present

## 2023-11-05 DIAGNOSIS — M25561 Pain in right knee: Secondary | ICD-10-CM | POA: Diagnosis not present

## 2023-11-12 DIAGNOSIS — M25561 Pain in right knee: Secondary | ICD-10-CM | POA: Diagnosis not present

## 2023-11-19 DIAGNOSIS — M25561 Pain in right knee: Secondary | ICD-10-CM | POA: Diagnosis not present

## 2023-12-08 ENCOUNTER — Ambulatory Visit: Admitting: Family Medicine

## 2024-01-11 ENCOUNTER — Encounter (HOSPITAL_COMMUNITY): Payer: Self-pay

## 2024-01-11 ENCOUNTER — Ambulatory Visit (HOSPITAL_COMMUNITY)
Admission: EM | Admit: 2024-01-11 | Discharge: 2024-01-11 | Disposition: A | Discharge status: Home / Self Care | Attending: Physician Assistant | Admitting: Physician Assistant

## 2024-01-11 ENCOUNTER — Ambulatory Visit (HOSPITAL_COMMUNITY): Payer: Self-pay | Admitting: Physician Assistant

## 2024-01-11 DIAGNOSIS — R8290 Unspecified abnormal findings in urine: Secondary | ICD-10-CM | POA: Insufficient documentation

## 2024-01-11 DIAGNOSIS — Z79899 Other long term (current) drug therapy: Secondary | ICD-10-CM | POA: Insufficient documentation

## 2024-01-11 DIAGNOSIS — R300 Dysuria: Secondary | ICD-10-CM | POA: Insufficient documentation

## 2024-01-11 DIAGNOSIS — R81 Glycosuria: Secondary | ICD-10-CM | POA: Insufficient documentation

## 2024-01-11 LAB — POCT URINALYSIS DIP (MANUAL ENTRY)
Bilirubin, UA: NEGATIVE
Blood, UA: NEGATIVE
Glucose, UA: 100 mg/dL — AB
Ketones, POC UA: NEGATIVE mg/dL
Leukocytes, UA: NEGATIVE
Nitrite, UA: NEGATIVE
Protein Ur, POC: NEGATIVE mg/dL
Spec Grav, UA: 1.01 (ref 1.010–1.025)
Urobilinogen, UA: 0.2 U/dL
pH, UA: 6 (ref 5.0–8.0)

## 2024-01-11 LAB — CBC WITH DIFFERENTIAL/PLATELET
Abs Immature Granulocytes: 0.03 K/uL (ref 0.00–0.07)
Basophils Absolute: 0.1 K/uL (ref 0.0–0.1)
Basophils Relative: 1 %
Eosinophils Absolute: 0.2 K/uL (ref 0.0–0.5)
Eosinophils Relative: 2 %
HCT: 42.2 % (ref 36.0–46.0)
Hemoglobin: 13.9 g/dL (ref 12.0–15.0)
Immature Granulocytes: 0 %
Lymphocytes Relative: 32 %
Lymphs Abs: 2.8 K/uL (ref 0.7–4.0)
MCH: 31.3 pg (ref 26.0–34.0)
MCHC: 32.9 g/dL (ref 30.0–36.0)
MCV: 95 fL (ref 80.0–100.0)
Monocytes Absolute: 0.8 K/uL (ref 0.1–1.0)
Monocytes Relative: 10 %
Neutro Abs: 4.8 K/uL (ref 1.7–7.7)
Neutrophils Relative %: 55 %
Platelets: 283 K/uL (ref 150–400)
RBC: 4.44 MIL/uL (ref 3.87–5.11)
RDW: 12 % (ref 11.5–15.5)
WBC: 8.6 K/uL (ref 4.0–10.5)
nRBC: 0 % (ref 0.0–0.2)

## 2024-01-11 LAB — COMPREHENSIVE METABOLIC PANEL WITH GFR
ALT: 47 U/L — ABNORMAL HIGH (ref 0–44)
AST: 34 U/L (ref 15–41)
Albumin: 3.7 g/dL (ref 3.5–5.0)
Alkaline Phosphatase: 67 U/L (ref 38–126)
Anion gap: 11 (ref 5–15)
BUN: 11 mg/dL (ref 6–20)
CO2: 24 mmol/L (ref 22–32)
Calcium: 9.1 mg/dL (ref 8.9–10.3)
Chloride: 100 mmol/L (ref 98–111)
Creatinine, Ser: 0.74 mg/dL (ref 0.44–1.00)
GFR, Estimated: >60 mL/min (ref >60)
Glucose, Bld: 118 mg/dL — ABNORMAL HIGH (ref 70–99)
Potassium: 3.9 mmol/L (ref 3.5–5.1)
Sodium: 135 mmol/L (ref 135–145)
Total Bilirubin: 0.5 mg/dL (ref 0.0–1.2)
Total Protein: 6.9 g/dL (ref 6.5–8.1)

## 2024-01-11 LAB — HEMOGLOBIN A1C
Hgb A1c MFr Bld: 6 % — ABNORMAL HIGH (ref 4.8–5.6)
Mean Plasma Glucose: 125.5 mg/dL

## 2024-01-11 LAB — POCT URINE PREGNANCY: Preg Test, Ur: NEGATIVE

## 2024-01-11 LAB — POCT FASTING CBG KUC MANUAL ENTRY: POCT Glucose (KUC): 182 mg/dL — AB (ref 70–99)

## 2024-01-11 MED ORDER — CEPHALEXIN 500 MG PO CAPS: 500.0000 mg | ORAL_CAPSULE | Freq: Four times a day (QID) | ORAL | 0 refills | Status: AC

## 2024-01-11 NOTE — ED Triage Notes (Signed)
 Pt states that she has lower abdominal pain. Pt states that she has discoloration and odor of her urine. X3-4 weeks

## 2024-01-11 NOTE — Discharge Instructions (Signed)
 Your urine had some sugar but did not look obviously infected.  Since you have been having classic UTI symptoms for several weeks I am going to start an antibiotic but send your urine off for culture.  If we need to stop or change your antibiotics we will contact you.  Make sure you drink plenty of water.  I am more concerned that you have developed diabetes and this is contributing to your symptoms.  I will contact you once I have your blood work.  Please follow-up with your primary care within 1 to 2 weeks.  If you have any worsening symptoms including abdominal pain, pelvic pain, fever, nausea, vomiting, shortness of breath, confusion you need to be seen immediately.  Su orina contena algo de azcar, pero no pareca estar infectada. Dado que lleva varias semanas con sntomas clsicos de infeccin urinaria, Audiological scientist un antibitico, pero le solicitar un cultivo de [place]. Si necesitamos suspender o cambiar sus antibiticos, nos pondremos en contacto con usted. Asegrese de beber [place] [employer]. Me preocupa ms que haya desarrollado diabetes y que esto est contribuyendo a sus sntomas. Me pondr en contacto con usted una vez que tenga sus anlisis de [place]. Por favor, consulte con su mdico de cabecera en [employer]. Si presenta algn empeoramiento de sus sntomas, como dolor abdominal, dolor plvico, fiebre, nuseas, vmitos, dificultad para respirar o confusin, debe ser examinado de inmediato.

## 2024-01-11 NOTE — ED Provider Notes (Signed)
 [facility]    CSN: [id number] Arrival date & time: 01/11/24  1034      History   Chief Complaint Chief Complaint  Patient presents with   Abdominal Pain    HPI Michelle Clay is a 53 y.o. female.   Patient presents today with several week history (2-3) of urinary symptoms including frequency, dysuria, intermittent lower abdominal discomfort, malodorous urine.  She is Spanish-speaking and video interpreter was utilized during visit.  She denies any vaginal discharge, pelvic pain, fever, nausea, vomiting.  She has not been taking any over-the-counter medication for symptom management.  Denies any recent antibiotics.  She denies any recent urogenital procedure, catheterization, history of nephrolithiasis, single kidney.  She does not take an SGLT2 inhibitor.  She denies history of diabetes but does have prediabetes listed on her chart.  She does not believe that she could be pregnant as she has been through menopause but has not had a hysterectomy.      Past Medical History:  Diagnosis Date   Acid reflux    Hypertension     Patient Active Problem List   Diagnosis Date Noted   Chronic pain of right knee 09/05/2023   Chronic pain of left knee 05/28/2022   Hepatic steatosis 04/06/2021   GERD (gastroesophageal reflux disease) 04/06/2021   Cough with congestion of paranasal sinus 11/22/2019   Essential hypertension 08/24/2019   Prediabetes 08/24/2019   Pure hypercholesterolemia 08/24/2019    History reviewed. No pertinent surgical history.  OB History     Gravida  2   Para      Term      Preterm      AB      Living         SAB      IAB      Ectopic      Multiple      Live Births  2            Home Medications    Prior to Admission medications   Medication Sig Start Date End Date Taking? Authorizing Provider  cephALEXin  (KEFLEX ) 500 MG capsule Take 1 capsule (500 mg total) by mouth 4 (four) times daily. 01/11/24  Yes Berman Grainger K, PA-C   lisinopril  (ZESTRIL ) 30 MG tablet Take 1 tablet (30 mg total) by mouth daily. 09/05/23 03/03/24 Yes Rosendo Rush, MD    Family History Family History  Problem Relation Age of Onset   Hypertension Mother    Hypertension Sister     Social History Social History   Tobacco Use   Smoking status: Never   Smokeless tobacco: Never  Vaping Use   Vaping status: Never Used  Substance Use Topics   Alcohol use: No   Drug use: Never     Allergies   Patient has no known allergies.   Review of Systems Review of Systems  Constitutional:  Positive for activity change. Negative for appetite change, fatigue and fever.  Respiratory:  Negative for shortness of breath.   Gastrointestinal:  Negative for abdominal pain (Intermittent lower abdominal pain; no current pain), constipation, diarrhea, nausea and vomiting.  Endocrine: Negative for polydipsia, polyphagia and polyuria.  Genitourinary:  Positive for dysuria and frequency. Negative for hematuria, menstrual problem, pelvic pain, urgency, vaginal bleeding, vaginal discharge and vaginal pain.     Physical Exam Triage Vital Signs ED Triage Vitals  Encounter Vitals Group     BP 01/11/24 1135 (!) 152/84     Girls Systolic BP Percentile --  Girls Diastolic BP Percentile --      Boys Systolic BP Percentile --      Boys Diastolic BP Percentile --      Pulse Rate 01/11/24 1135 80     Resp 01/11/24 1135 17     Temp 01/11/24 1135 98.9 F (37.2 C)     Temp Source 01/11/24 1135 Oral     SpO2 01/11/24 1135 97 %     Weight 01/11/24 1134 170 lb (77.1 kg)     Height 01/11/24 1134 5' 6 (1.676 m)     Head Circumference --      Peak Flow --      Pain Score 01/11/24 1133 5     Pain Loc --      Pain Education --      Exclude from Growth Chart --    No data found.  Updated Vital Signs BP (!) 152/84 (BP Location: Left Arm)   Pulse 80   Temp 98.9 F (37.2 C) (Oral)   Resp 17   Ht 5' 6 (1.676 m)   Wt 170 lb (77.1 kg)   LMP  (LMP  Unknown)   SpO2 97%   BMI 27.44 kg/m   Visual Acuity Right Eye Distance:   Left Eye Distance:   Bilateral Distance:    Right Eye Near:   Left Eye Near:    Bilateral Near:     Physical Exam Vitals reviewed.  Constitutional:      General: She is awake. She is not in acute distress.    Appearance: Normal appearance. She is well-developed. She is not ill-appearing.     Comments: Very pleasant female appeared stated age in no acute distress sitting comfortably in exam room  HENT:     Head: Normocephalic and atraumatic.  Cardiovascular:     Rate and Rhythm: Normal rate and regular rhythm.     Heart sounds: Normal heart sounds, S1 normal and S2 normal. No murmur heard. Pulmonary:     Effort: Pulmonary effort is normal.     Breath sounds: Normal breath sounds. No wheezing, rhonchi or rales.     Comments: Clear to auscultation bilaterally Abdominal:     General: Bowel sounds are normal.     Palpations: Abdomen is soft.     Tenderness: There is no abdominal tenderness. There is no right CVA tenderness, left CVA tenderness, guarding or rebound.     Comments: Benign abdominal exam.  No tenderness to palpation.  No CVA tenderness.  Psychiatric:        Behavior: Behavior is cooperative.      UC Treatments / Results  Labs (all labs ordered are listed, but only abnormal results are displayed) Labs Reviewed  POCT URINALYSIS DIP (MANUAL ENTRY) - Abnormal; Notable for the following components:      Result Value   Glucose, UA =100 (*)    All other components within normal limits  POCT FASTING CBG [facility] MANUAL ENTRY - Abnormal; Notable for the following components:   POCT Glucose ([facility]) 182 (*)    All other components within normal limits  URINE CULTURE  COMPREHENSIVE METABOLIC PANEL WITH GFR  CBC WITH DIFFERENTIAL/PLATELET  HEMOGLOBIN A1C  POCT URINE PREGNANCY    EKG   Radiology No results found.  Procedures Procedures (including critical care time)  Medications Ordered in  UC Medications - No data to display  Initial Impression / Assessment and Plan / UC Course  I have reviewed the triage vital signs and the nursing  notes.  Pertinent labs & imaging results that were available during my care of the patient were reviewed by me and considered in my medical decision making (see chart for details).     Patient is well-appearing, afebrile, nontoxic, nontachycardic.  UA was obtained that showed mild glucosuria without ketones or evidence of infection.  Patient reports that she has had significant UTI symptoms for several weeks and so will empirically treat with cephalexin  and send this for culture.  We did discuss that elevated glucose could be contributing to her symptoms and so fingerstick was obtained that was 182.  She does have a history of prediabetes and her last A1c obtained 12/05/2020 was elevated at 6.0%.  Will obtain basic blood work including CBC, CMP, A1c make additional recommendations based on these laboratory findings.  We will send her urine for culture and contact her if we need to discontinue or change her antibiotics based on culture results.  Urine pregnancy was negative.  We discussed that if she develops any pelvic pain, abdominal pain, fever, nausea, vomiting, confusion, shortness of breath she needs to be seen emergently.  Recommend follow-up with her primary care within 1 to 2 weeks.  We discussed that if anything changes or worsens she should return for reevaluation.  Final Clinical Impressions(s) / UC Diagnoses   Final diagnoses:  Glucosuria  Dysuria  Malodorous urine     Discharge Instructions      Your urine had some sugar but did not look obviously infected.  Since you have been having classic UTI symptoms for several weeks I am going to start an antibiotic but send your urine off for culture.  If we need to stop or change your antibiotics we will contact you.  Make sure you drink plenty of water.  I am more concerned that you have  developed diabetes and this is contributing to your symptoms.  I will contact you once I have your blood work.  Please follow-up with your primary care within 1 to 2 weeks.  If you have any worsening symptoms including abdominal pain, pelvic pain, fever, nausea, vomiting, shortness of breath, confusion you need to be seen immediately.  Su orina contena algo de azcar, pero no pareca estar infectada. Dado que lleva varias semanas con sntomas clsicos de infeccin urinaria, Audiological scientist un antibitico, pero le solicitar un cultivo de [place]. Si necesitamos suspender o cambiar sus antibiticos, nos pondremos en contacto con usted. Asegrese de beber [place]. Me preocupa ms que haya desarrollado diabetes y que esto est contribuyendo a sus sntomas. Me pondr en contacto con usted una vez que tenga sus anlisis de [place]. Por favor, consulte con su mdico de cabecera en [employer]. Si presenta algn empeoramiento de sus sntomas, como dolor abdominal, dolor plvico, fiebre, nuseas, vmitos, dificultad para respirar o confusin, debe ser examinado de inmediato.     ED Prescriptions     Medication Sig Dispense Auth. Provider   cephALEXin  (KEFLEX ) 500 MG capsule Take 1 capsule (500 mg total) by mouth 4 (four) times daily. 20 capsule Suraya Vidrine K, PA-C      PDMP not reviewed this encounter.   Sherrell Rocky POUR, PA-C 01/11/24 1242

## 2024-01-12 LAB — URINE CULTURE: Culture: <10000 — AB

## 2024-02-02 ENCOUNTER — Encounter: Payer: Self-pay | Admitting: Student

## 2024-02-02 ENCOUNTER — Ambulatory Visit (INDEPENDENT_AMBULATORY_CARE_PROVIDER_SITE_OTHER): Admitting: Student

## 2024-02-02 VITALS — BP 125/72 | HR 66 | Ht 66.0 in | Wt 177.4 lb

## 2024-02-02 DIAGNOSIS — R7303 Prediabetes: Secondary | ICD-10-CM

## 2024-02-02 DIAGNOSIS — Z23 Encounter for immunization: Secondary | ICD-10-CM

## 2024-02-02 DIAGNOSIS — Z0000 Encounter for general adult medical examination without abnormal findings: Secondary | ICD-10-CM

## 2024-02-02 DIAGNOSIS — R8290 Unspecified abnormal findings in urine: Secondary | ICD-10-CM | POA: Diagnosis not present

## 2024-02-02 DIAGNOSIS — Z1211 Encounter for screening for malignant neoplasm of colon: Secondary | ICD-10-CM

## 2024-02-02 LAB — POCT URINALYSIS DIP (MANUAL ENTRY)
Bilirubin, UA: NEGATIVE
Blood, UA: NEGATIVE
Glucose, UA: NEGATIVE mg/dL
Ketones, POC UA: NEGATIVE mg/dL
Leukocytes, UA: NEGATIVE
Nitrite, UA: NEGATIVE
Protein Ur, POC: NEGATIVE mg/dL
Spec Grav, UA: 1.025 (ref 1.010–1.025)
Urobilinogen, UA: 0.2 U/dL
pH, UA: 5.5 (ref 5.0–8.0)

## 2024-02-02 NOTE — Patient Instructions (Addendum)
 Un placer, Paramedic.  Hoy ordenamos anlisis para verificar su nivel de colesterol.  Tambin le envi una derivacin a un gastroenterlogo para su colonoscopia.  Asegrese de estar atento a una llamada de ellos para programar esa cita.  Por los sntomas de garganta que tiene, sospecho que es posible que [place].  Recomiendo tomar 20 mg de omeprazol de venta libre una vez al da durante un mes.  Si los sntomas continan despus de eso, regrese para ser reevaluado.  Sospecho que tu sueo podra deberse a sntomas posmenopusicos.  Esto no es inusual para las mujeres que ya no tienen el perodo.  Recomiendo tomar melatonina justo antes de ir a dormir.  O puede probar el cohosh negro, que es un suplemento que, segn las [place], ha ayudado con la menopausia.  Hemos ordenado una prueba de orina hoy para detectar una posible ITU debido a sus sntomas.    Pleasure pleasure to see you today.  Today we ordered labs to check your cholesterol level.  I have also sent a referral to gastroenterologist for your colonoscopy.  Please make sure to look out for a call from them to schedule that appointment.  For the throat symptoms you are having I suspect you possibly could have GERD.  I recommend doing over-the-counter omeprazole  20 mg once daily for a month.  If symptoms continues after that return to be reevaluated.  Your sleep I suspect could be due to postmenopausal symptoms.  This is not unusual for women who are no longer having the period.  I recommend doing melatonin just right before you go to sleep.  Or you can try black cohosh is a supplement that acts women have said has helped with menopause.  We have ordered urine test today to check for possible UTI due to your symptoms.

## 2024-02-02 NOTE — Progress Notes (Signed)
    SUBJECTIVE:   CHIEF COMPLAINT / HPI:   53 year old female with history of prediabetes presenting today for follow-up visit.  She does not have any medical concerns today.  Reports having good appetite and eating good, varied diet.  No issues with falling asleep though had issues staying asleep.  She reports waking up 3-4 times throughout the night.  Bouts of hot flashes and this could be attributing to her poor sleep.  Menopause was about 3 years ago. LMP was in 2022.  Denies any history of tobacco use.  No alcohol use and no illicit drug use.  Does not work outs but generally active.  Works at [employer] place where she has to [employer] most of her shift.  PERTINENT  PMH / PSH: Reviewed   OBJECTIVE:   BP 125/72   Pulse 66   Ht 5' 6 (1.676 m)   Wt 177 lb 6.4 oz (80.5 kg)   LMP  (LMP Unknown)   SpO2 100%   BMI 28.63 kg/m    Physical Exam General: Alert, well appearing, NAD Cardiovascular: RRR, No Murmurs, Normal S2/S2 Respiratory: CTAB, No wheezing or Rales Abdomen: No distension or tenderness Extremities: No edema on extremities   Skin: Warm and dry  ASSESSMENT/PLAN:   Annual Physical Reviewed patient's Family Medical History Reviewed and updated list of patient's medical providers Counseled patient on tobacco, alcohol use Recommend moderate exercise at least 150 hours a week Emphasized need for healthy dieting Assessed patient's functional ability Health Risk Assessent Completed and Reviewed.  Considered the following screening exams based upon USPSTF recommendations: Diabetes screening: Last A1c 3 weeks ago 6.0 Screening for elevated cholesterol: Ordered lipid panel Colorectal cancer screening: Placed GI referral for colonoscopy Influenza vaccine: Administered and well-tolerated by patient.  Foul-smelling urine Denies any dysuria.  Patient requesting UA today.  UA ordered which was negative for UTI  Poor sleep No issues falling asleep.  Mostly issue  weight staying asleep involving awakening multiple times throughout the night.  Suspect this could be attributed to vasomotor symptoms given her menopause.  Recommend melatonin prior to sleep and considering black cohosh.  If symptoms does not improve can consider SSRIs.    Norleen April, MD [facility]

## 2024-02-03 LAB — LIPID PANEL
Chol/HDL Ratio: 4.2 ratio (ref 0.0–4.4)
Cholesterol, Total: 254 mg/dL — ABNORMAL HIGH (ref 100–199)
HDL: 61 mg/dL (ref >39)
LDL Chol Calc (NIH): 162 mg/dL — ABNORMAL HIGH (ref 0–99)
Triglycerides: 171 mg/dL — ABNORMAL HIGH (ref 0–149)
VLDL Cholesterol Cal: 31 mg/dL (ref 5–40)

## 2024-04-26 ENCOUNTER — Other Ambulatory Visit: Payer: Self-pay | Admitting: Family Medicine

## 2024-04-26 DIAGNOSIS — I10 Essential (primary) hypertension: Secondary | ICD-10-CM

## 2024-04-26 MED ORDER — LISINOPRIL 30 MG PO TABS: 30.0000 mg | ORAL_TABLET | Freq: Every day | ORAL | 1 refills | Status: AC
